# Patient Record
Sex: Male | Born: 1965 | Race: Black or African American | Hispanic: No | Marital: Married | Smoking: Never smoker
Health system: Southern US, Community
[De-identification: ages and names within clinical notes are randomized; demographics above are authoritative.]

## PROBLEM LIST (undated history)

## (undated) DIAGNOSIS — Z9889 Other specified postprocedural states: Secondary | ICD-10-CM

## (undated) DIAGNOSIS — I10 Essential (primary) hypertension: Secondary | ICD-10-CM

## (undated) DIAGNOSIS — I509 Heart failure, unspecified: Secondary | ICD-10-CM

---

## 2003-03-09 ENCOUNTER — Other Ambulatory Visit: Payer: Self-pay

## 2005-08-04 ENCOUNTER — Other Ambulatory Visit: Payer: Self-pay

## 2005-08-04 ENCOUNTER — Emergency Department: Payer: Self-pay | Admitting: Emergency Medicine

## 2005-08-08 ENCOUNTER — Inpatient Hospital Stay: Payer: Self-pay

## 2005-08-08 ENCOUNTER — Other Ambulatory Visit: Payer: Self-pay

## 2005-10-09 ENCOUNTER — Encounter: Admission: RE | Admit: 2005-10-09 | Discharge: 2005-10-09 | Payer: Self-pay | Admitting: Nephrology

## 2006-11-24 ENCOUNTER — Other Ambulatory Visit: Payer: Self-pay

## 2006-11-24 ENCOUNTER — Emergency Department: Payer: Self-pay | Admitting: Emergency Medicine

## 2007-03-02 ENCOUNTER — Other Ambulatory Visit: Payer: Self-pay

## 2007-03-02 ENCOUNTER — Inpatient Hospital Stay: Payer: Self-pay | Admitting: Internal Medicine

## 2008-05-21 ENCOUNTER — Inpatient Hospital Stay: Payer: Self-pay | Admitting: Internal Medicine

## 2008-07-28 ENCOUNTER — Emergency Department: Payer: Self-pay | Admitting: Emergency Medicine

## 2008-11-30 ENCOUNTER — Inpatient Hospital Stay: Payer: Self-pay | Admitting: Internal Medicine

## 2008-12-11 ENCOUNTER — Ambulatory Visit: Payer: Self-pay | Admitting: Cardiovascular Disease

## 2009-05-15 ENCOUNTER — Inpatient Hospital Stay: Payer: Self-pay | Admitting: Internal Medicine

## 2010-01-29 ENCOUNTER — Ambulatory Visit: Payer: Self-pay | Admitting: Cardiovascular Disease

## 2011-07-21 ENCOUNTER — Inpatient Hospital Stay: Payer: Self-pay | Admitting: Internal Medicine

## 2011-07-21 LAB — COMPREHENSIVE METABOLIC PANEL
Anion Gap: 8 (ref 7–16)
Bilirubin,Total: 0.8 mg/dL (ref 0.2–1.0)
Chloride: 104 mmol/L (ref 98–107)
Co2: 29 mmol/L (ref 21–32)
Creatinine: 1.42 mg/dL — ABNORMAL HIGH (ref 0.60–1.30)
EGFR (African American): >60
EGFR (Non-African Amer.): 58 — ABNORMAL LOW
Glucose: 141 mg/dL — ABNORMAL HIGH (ref 65–99)
Osmolality: 285 (ref 275–301)
Potassium: 3.8 mmol/L (ref 3.5–5.1)
SGPT (ALT): 21 U/L
Total Protein: 8.2 g/dL (ref 6.4–8.2)

## 2011-07-21 LAB — CBC
HCT: 47.4 % (ref 40.0–52.0)
MCH: 25.5 pg — ABNORMAL LOW (ref 26.0–34.0)
MCHC: 31.8 g/dL — ABNORMAL LOW (ref 32.0–36.0)
RDW: 15.9 % — ABNORMAL HIGH (ref 11.5–14.5)

## 2011-07-21 LAB — CK TOTAL AND CKMB (NOT AT ARMC)
CK, Total: 221 U/L (ref 35–232)
CK-MB: 2.4 ng/mL (ref 0.5–3.6)

## 2011-07-21 LAB — TROPONIN I
Troponin-I: 0.07 ng/mL — ABNORMAL HIGH
Troponin-I: 0.07 ng/mL — ABNORMAL HIGH

## 2011-07-22 LAB — BASIC METABOLIC PANEL
Anion Gap: 8 (ref 7–16)
Calcium, Total: 9.3 mg/dL (ref 8.5–10.1)
Co2: 28 mmol/L (ref 21–32)
Creatinine: 1.45 mg/dL — ABNORMAL HIGH (ref 0.60–1.30)
EGFR (African American): >60
EGFR (Non-African Amer.): 57 — ABNORMAL LOW

## 2011-07-22 LAB — CBC WITH DIFFERENTIAL/PLATELET
Basophil %: 1 %
Eosinophil %: 3.8 %
HCT: 46 % (ref 40.0–52.0)
HGB: 14.5 g/dL (ref 13.0–18.0)
Lymphocyte %: 43.5 %
MCV: 79 fL — ABNORMAL LOW (ref 80–100)
Monocyte %: 8.4 %
Neutrophil %: 43.3 %
WBC: 6.8 10*3/uL (ref 3.8–10.6)

## 2012-02-03 ENCOUNTER — Ambulatory Visit: Payer: Self-pay | Admitting: Hematology and Oncology

## 2012-02-03 LAB — BASIC METABOLIC PANEL
BUN: 24 mg/dL — ABNORMAL HIGH (ref 7–18)
Calcium, Total: 9 mg/dL (ref 8.5–10.1)
Creatinine: 1.74 mg/dL — ABNORMAL HIGH (ref 0.60–1.30)
EGFR (African American): 53 — ABNORMAL LOW
EGFR (Non-African Amer.): 45 — ABNORMAL LOW
Glucose: 122 mg/dL — ABNORMAL HIGH (ref 65–99)

## 2012-02-03 LAB — CBC CANCER CENTER
Basophil %: 0.3 %
Eosinophil #: 0.2 x10 3/mm (ref 0.0–0.7)
HGB: 14 g/dL (ref 13.0–18.0)
Monocyte #: 0.4 x10 3/mm (ref 0.2–1.0)
Monocyte %: 6.8 %
Neutrophil %: 46.5 %
RDW: 15.6 % — ABNORMAL HIGH (ref 11.5–14.5)

## 2012-02-13 ENCOUNTER — Ambulatory Visit: Payer: Self-pay | Admitting: Hematology and Oncology

## 2012-02-18 ENCOUNTER — Inpatient Hospital Stay: Payer: Self-pay | Admitting: Internal Medicine

## 2012-02-18 LAB — COMPREHENSIVE METABOLIC PANEL
Bilirubin,Total: 0.9 mg/dL (ref 0.2–1.0)
Co2: 22 mmol/L (ref 21–32)
Creatinine: 1.64 mg/dL — ABNORMAL HIGH (ref 0.60–1.30)
Potassium: 4.3 mmol/L (ref 3.5–5.1)
SGOT(AST): 21 U/L (ref 15–37)
SGPT (ALT): 23 U/L (ref 12–78)
Sodium: 136 mmol/L (ref 136–145)
Total Protein: 8 g/dL (ref 6.4–8.2)

## 2012-02-18 LAB — URINALYSIS, COMPLETE
Bacteria: NONE SEEN
Granular Cast: 1
Hyaline Cast: 11
Ketone: NEGATIVE
Nitrite: NEGATIVE
Specific Gravity: 1.018 (ref 1.003–1.030)
Squamous Epithelial: NONE SEEN
WBC UR: 1 /HPF (ref 0–5)

## 2012-02-18 LAB — DRUG SCREEN, URINE
Barbiturates, Ur Screen: NEGATIVE (ref ?–200)
Benzodiazepine, Ur Scrn: NEGATIVE (ref ?–200)
Cocaine Metabolite,Ur ~~LOC~~: NEGATIVE (ref ?–300)
MDMA (Ecstasy)Ur Screen: NEGATIVE (ref ?–500)
Methadone, Ur Screen: NEGATIVE (ref ?–300)
Tricyclic, Ur Screen: NEGATIVE (ref ?–1000)

## 2012-02-18 LAB — CBC WITH DIFFERENTIAL/PLATELET
Basophil %: 0.4 %
Eosinophil %: 1.2 %
HGB: 15.4 g/dL (ref 13.0–18.0)
Lymphocyte #: 1.8 10*3/uL (ref 1.0–3.6)
Lymphocyte %: 20.4 %
MCH: 25.7 pg — ABNORMAL LOW (ref 26.0–34.0)
Monocyte %: 4.3 %
Neutrophil #: 6.4 10*3/uL (ref 1.4–6.5)
Platelet: 158 10*3/uL (ref 150–440)
RBC: 5.97 10*6/uL — ABNORMAL HIGH (ref 4.40–5.90)
RDW: 15.8 % — ABNORMAL HIGH (ref 11.5–14.5)

## 2012-02-19 ENCOUNTER — Ambulatory Visit: Payer: Self-pay | Admitting: Neurology

## 2012-02-19 LAB — BASIC METABOLIC PANEL
Anion Gap: 8 (ref 7–16)
Chloride: 106 mmol/L (ref 98–107)
Creatinine: 1.35 mg/dL — ABNORMAL HIGH (ref 0.60–1.30)
EGFR (African American): >60
EGFR (Non-African Amer.): >60
Glucose: 109 mg/dL — ABNORMAL HIGH (ref 65–99)
Osmolality: 280 (ref 275–301)
Potassium: 3.7 mmol/L (ref 3.5–5.1)

## 2012-02-19 LAB — MAGNESIUM: Magnesium: 1.8 mg/dL

## 2012-02-19 LAB — CBC WITH DIFFERENTIAL/PLATELET
Eosinophil %: 1.6 %
HCT: 43.8 % (ref 40.0–52.0)
HGB: 14.2 g/dL (ref 13.0–18.0)
Lymphocyte %: 35.8 %
MCH: 25.8 pg — ABNORMAL LOW (ref 26.0–34.0)
MCHC: 32.4 g/dL (ref 32.0–36.0)
Monocyte #: 0.5 x10 3/mm (ref 0.2–1.0)
RBC: 5.49 10*6/uL (ref 4.40–5.90)

## 2012-02-19 LAB — LIPID PANEL: Triglycerides: 108 mg/dL (ref 0–200)

## 2012-02-19 LAB — HEMOGLOBIN A1C: Hemoglobin A1C: 6.5 % — ABNORMAL HIGH (ref 4.2–6.3)

## 2012-02-20 LAB — BASIC METABOLIC PANEL
BUN: 23 mg/dL — ABNORMAL HIGH (ref 7–18)
Calcium, Total: 9.5 mg/dL (ref 8.5–10.1)
Creatinine: 1.46 mg/dL — ABNORMAL HIGH (ref 0.60–1.30)
EGFR (Non-African Amer.): 56 — ABNORMAL LOW
Glucose: 105 mg/dL — ABNORMAL HIGH (ref 65–99)
Potassium: 4.1 mmol/L (ref 3.5–5.1)
Sodium: 139 mmol/L (ref 136–145)

## 2012-03-02 LAB — PROT IMMUNOELECTROPHORES(ARMC)

## 2012-03-02 LAB — KAPPA/LAMBDA FREE LIGHT CHAINS (ARMC)

## 2012-03-12 ENCOUNTER — Ambulatory Visit: Payer: Self-pay | Admitting: Hematology and Oncology

## 2012-09-30 ENCOUNTER — Inpatient Hospital Stay: Payer: Self-pay | Admitting: Internal Medicine

## 2012-09-30 LAB — BASIC METABOLIC PANEL
BUN: 18 mg/dL (ref 7–18)
Calcium, Total: 9.2 mg/dL (ref 8.5–10.1)
Co2: 27 mmol/L (ref 21–32)
Creatinine: 1.56 mg/dL — ABNORMAL HIGH (ref 0.60–1.30)
EGFR (Non-African Amer.): 51 — ABNORMAL LOW
Osmolality: 278 (ref 275–301)
Potassium: 3.7 mmol/L (ref 3.5–5.1)

## 2012-09-30 LAB — CBC
MCV: 80 fL (ref 80–100)
RBC: 6.27 10*6/uL — ABNORMAL HIGH (ref 4.40–5.90)
WBC: 9.3 10*3/uL (ref 3.8–10.6)

## 2012-09-30 LAB — PROTIME-INR
INR: 1
Prothrombin Time: 13.7 secs (ref 11.5–14.7)

## 2012-09-30 LAB — TROPONIN I
Troponin-I: 0.11 ng/mL — ABNORMAL HIGH
Troponin-I: 0.11 ng/mL — ABNORMAL HIGH
Troponin-I: 0.1 ng/mL — ABNORMAL HIGH

## 2012-09-30 LAB — CK TOTAL AND CKMB (NOT AT ARMC)
CK, Total: 282 U/L — ABNORMAL HIGH (ref 35–232)
CK, Total: 262 U/L — ABNORMAL HIGH (ref 35–232)
CK-MB: 5.1 ng/mL — ABNORMAL HIGH (ref 0.5–3.6)

## 2012-09-30 LAB — MAGNESIUM: Magnesium: 1.4 mg/dL — ABNORMAL LOW

## 2012-10-01 LAB — COMPREHENSIVE METABOLIC PANEL
Co2: 27 mmol/L (ref 21–32)
SGPT (ALT): 22 U/L (ref 12–78)
Sodium: 137 mmol/L (ref 136–145)
Total Protein: 6.5 g/dL (ref 6.4–8.2)

## 2012-10-02 LAB — BASIC METABOLIC PANEL
Anion Gap: 5 — ABNORMAL LOW (ref 7–16)
BUN: 24 mg/dL — ABNORMAL HIGH (ref 7–18)
Calcium, Total: 9.1 mg/dL (ref 8.5–10.1)
Chloride: 106 mmol/L (ref 98–107)
Co2: 27 mmol/L (ref 21–32)
Creatinine: 1.47 mg/dL — ABNORMAL HIGH (ref 0.60–1.30)
EGFR (African American): >60
EGFR (Non-African Amer.): 55 — ABNORMAL LOW
Glucose: 102 mg/dL — ABNORMAL HIGH (ref 65–99)
Osmolality: 280 (ref 275–301)
Potassium: 3.3 mmol/L — ABNORMAL LOW (ref 3.5–5.1)
Sodium: 138 mmol/L (ref 136–145)

## 2013-03-02 ENCOUNTER — Ambulatory Visit: Payer: Self-pay | Admitting: Internal Medicine

## 2013-03-16 ENCOUNTER — Inpatient Hospital Stay: Payer: Self-pay | Admitting: Internal Medicine

## 2013-03-16 LAB — CBC WITH DIFFERENTIAL/PLATELET
BASOS ABS: 0 10*3/uL (ref 0.0–0.1)
BASOS PCT: 0.7 %
EOS PCT: 1.5 %
Eosinophil #: 0.1 10*3/uL (ref 0.0–0.7)
HCT: 41.2 % (ref 40.0–52.0)
HGB: 13 g/dL (ref 13.0–18.0)
Lymphocyte #: 2.1 10*3/uL (ref 1.0–3.6)
Lymphocyte %: 50 %
MCH: 25.1 pg — AB (ref 26.0–34.0)
MCHC: 31.6 g/dL — AB (ref 32.0–36.0)
MCV: 79 fL — AB (ref 80–100)
MONO ABS: 0.5 x10 3/mm (ref 0.2–1.0)
Monocyte %: 11.1 %
NEUTROS ABS: 1.5 10*3/uL (ref 1.4–6.5)
Neutrophil %: 36.7 %
Platelet: 195 10*3/uL (ref 150–440)
RBC: 5.19 10*6/uL (ref 4.40–5.90)
RDW: 16.2 % — ABNORMAL HIGH (ref 11.5–14.5)
WBC: 4.2 10*3/uL (ref 3.8–10.6)

## 2013-03-16 LAB — COMPREHENSIVE METABOLIC PANEL
ALBUMIN: 3.2 g/dL — AB (ref 3.4–5.0)
ALK PHOS: 87 U/L
ALT: 43 U/L (ref 12–78)
ANION GAP: 4 — AB (ref 7–16)
AST: 47 U/L — AB (ref 15–37)
BUN: 19 mg/dL — ABNORMAL HIGH (ref 7–18)
Bilirubin,Total: 1.1 mg/dL — ABNORMAL HIGH (ref 0.2–1.0)
CHLORIDE: 105 mmol/L (ref 98–107)
CO2: 30 mmol/L (ref 21–32)
Calcium, Total: 8.4 mg/dL — ABNORMAL LOW (ref 8.5–10.1)
Creatinine: 1.53 mg/dL — ABNORMAL HIGH (ref 0.60–1.30)
GFR CALC NON AF AMER: 53 — AB
Glucose: 93 mg/dL (ref 65–99)
Osmolality: 279 (ref 275–301)
POTASSIUM: 3.7 mmol/L (ref 3.5–5.1)
SODIUM: 139 mmol/L (ref 136–145)
Total Protein: 6.6 g/dL (ref 6.4–8.2)

## 2013-03-16 LAB — PROTIME-INR
INR: 1
PROTHROMBIN TIME: 13.3 s (ref 11.5–14.7)

## 2013-03-17 LAB — BASIC METABOLIC PANEL
ANION GAP: 5 — AB (ref 7–16)
BUN: 16 mg/dL (ref 7–18)
CHLORIDE: 104 mmol/L (ref 98–107)
CREATININE: 1.37 mg/dL — AB (ref 0.60–1.30)
Calcium, Total: 8.5 mg/dL (ref 8.5–10.1)
Co2: 30 mmol/L (ref 21–32)
EGFR (Non-African Amer.): >60
Glucose: 81 mg/dL (ref 65–99)
Osmolality: 278 (ref 275–301)
Potassium: 3.4 mmol/L — ABNORMAL LOW (ref 3.5–5.1)
Sodium: 139 mmol/L (ref 136–145)

## 2013-04-01 ENCOUNTER — Ambulatory Visit: Payer: Self-pay | Admitting: Cardiovascular Disease

## 2014-01-12 DIAGNOSIS — Z9889 Other specified postprocedural states: Secondary | ICD-10-CM

## 2014-01-12 DIAGNOSIS — Z9289 Personal history of other medical treatment: Secondary | ICD-10-CM

## 2014-01-12 HISTORY — DX: Other specified postprocedural states: Z98.890

## 2014-01-12 HISTORY — DX: Personal history of other medical treatment: Z92.89

## 2014-05-04 NOTE — Discharge Summary (Signed)
PATIENT NAME:  Gregory Chase, Gregory Chase MR#:  [id number] DATE OF BIRTH:  1965-03-04  DATE OF ADMISSION:  02/18/2012 DATE OF DISCHARGE:  02/20/2012  DISCHARGE DIAGNOSES:  1. Left hemispheric parietal and frontal stroke.  2. Hypertension.  3. Chronic kidney disease.  4. Cardiomyopathy.  5. Obstructive sleep apnea.   PROCEDURES:  MRI of the brain, CT scan of the brain, carotid ultrasound, 2-D echocardiogram.   CONSULTATION: Neurology, Dr. 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 By the time of his arrival at the Emergency Room, his symptom had. He was evaluated by Dr. Manson Passey, who did call me expressing concern as to whether he should be admitted. I recommend that he be admitted for further evaluation. MRI of his brain subsequently showed left frontal and parietal infarcts consistent with a stroke. He was admitted to a monitored floor where he remained stable with permissive hypertension. He was started on Aggrenox as he has reported a history of exacerbation of gout with aspirin. First lipid profile showed an LDL below 100.  I did start him on a low dose of statin for secondary stroke prevention. His carotid ultrasound did not show obstructive carotid artery disease. His echocardiogram was negative for thrombus. He was evaluated by Neurology, who agreed with his work-up, and cleared him for discharge the following day. The patient'Jacobs Golab blood pressure improved after reintroduction of clonidine, and he was discharged home in satisfactory condition.   DIET: Low fat, low sodium.   ACTIVITY: The patient is restricted from duty until followup with me next week.    FOLLOWUP: Dr. Ellsworth Lennox in 1 week.   DISCHARGE PROCESS TIME SPENT: 32 minutes.    ____________________________ Silas Flood Ellsworth Lennox, MD sat:cb D: 02/21/2012 12:17:02 ET T: 02/21/2012 15:54:39 ET JOB#: [id number]  cc: Sheikh A. Ellsworth Lennox, MD, <Dictator> Charlesetta Garibaldi MD ELECTRONICALLY SIGNED 02/22/2012 12:47

## 2014-05-04 NOTE — Discharge Summary (Signed)
PATIENT NAME:  Gregory Chase, Gregory Chase MR#:  [id number] DATE OF BIRTH:  July 03, 1965  DATE OF ADMISSION:  09/30/2012 DATE OF DISCHARGE:  10/02/2012  ADMITTING PHYSICIAN: Susa Griffins, MD  DISCHARGING PHYSICIAN: Corky Downs, MD  DISCHARGE DIAGNOSES: 1.  Malignant hypertension.  2.  Flash pulmonary edema.  3.  Acute systolic congestive heart failure.   DISCHARGE MEDICATIONS: Please refer to discharge medication reconciliation.   HOSPITAL COURSE: This gentleman was admitted through the Emergency Room where he presented with uncontrolled hypertension and pulmonary edema with respiratory failure due to that. Please refer to the history and physical for details. The patient admitted to noncompliance with his dietary sodium restriction. Denied noncompliance with his medications. He was placed on intravenous Lasix. His antihypertensives were resumed with prompt symptomatic response, namely, after receiving IV labetalol and IV Lasix. His cardiac enzymes were elevated, however cardiology attributed this to demand ischemia.   The patient'[name] clinical status improved. He was weaned off oxygen. He was discharged home in a satisfactory condition.   CONSULTATIONS: Cardiology.   DISCHARGE INSTRUCTIONS:   DIET: Low-sodium.  ACTIVITY: As tolerated.  FOLLOWUP: With cardiology in 1 to 2 weeks and Dr. Ellsworth Lennox in 1 to 2 weeks.   ____________________________ Silas Flood Ellsworth Lennox, MD sat:jm D: 10/23/2012 12:19:15 ET T: 10/23/2012 14:19:37 ET JOB#: [id number]  cc: Marland Mcalpine A. Ellsworth Lennox, MD, <Dictator> Charlesetta Garibaldi MD ELECTRONICALLY SIGNED 11/02/2012 13:54

## 2014-05-04 NOTE — Consult Note (Signed)
PATIENT NAME:  Gregory Chase, NUON MR#:  [id number] DATE OF BIRTH:  August 20, 1965  DATE OF CONSULTATION:    REFERRING PHYSICIAN:   CONSULTING PHYSICIAN:  Pauletta Browns, MD  REASON FOR CONSULTATION:  New stroke.   HISTORY OF PRESENT ILLNESS:  This is a pleasant 49 year old male with past medical history of nonischemic cardiomyopathy, sleep apnea, known last EF in the past year from 30% to 40%, CKD, chronic hypertension who presented with transient episode of confusion and slurred speech.  Information obtained from patient and the patient's family who are at bedside.  The patient states symptoms started around 10:50 a.m. yesterday.  He works as a Personnel officer.  He was walking and attempted to open a door and had difficulty finding a button to open that door.  His coworker saw that, opened the door and brought him to sit down in a room.  When the co-worker was trying to speak with the patient, the patient says that it was difficult for him to get his words out.  These symptoms lasted for about 10 minutes and upon admission to Emergency Department his symptoms have resolved.  Currently he is completely back to his baseline.  Evaluation of his CAT scan showed no acute intracranial pathology.  Ultrasound carotids evaluated.  They did not show any hemodynamic stenosis.  I personally reviewed MRI of the brain that showed a significant left parietal infarct and very, very minimal tiny left frontal infarct.  The left parietal infarct is most over the watershed distribution, kind of the left MCA/PCA territory.    PAST MEDICAL HISTORY:  Significant for nonischemic cardiomyopathy as described above with ejection fraction of 30% to 40%, history of hypertension, gout, CKD, sleep apnea currently not on CPAP and obesity.   FAMILY HISTORY:  Mother died at age of 25 of coronary artery disease.  Dad with significant history of diabetes and as per patient died from a diabetic coma.   SOCIAL HISTORY:  Does not  smoke.  No alcohol.  No drug use and he lives with his wife.  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   ALLERGIES:  INCLUDE ACE INHIBITORS.  OUTPATIENT HOME MEDICATIONS:  Were reviewed and included Benicar, Coreg, clonidine, digoxin, hydralazine.   REVIEW OF SYSTEMS:  The patient currently has no complaints.   CONSTITUTIONAL:  There is no fever, fatigue, weakness, weight change.   HEENT:  No blurry vision.  No tinnitus.   RESPIRATORY:  No cough.   CARDIOVASCULAR:  Denies any chest pain.   GASTROINTESTINAL:  No nausea.  No vomiting.  No dysuria.  No hematuria.   MUSCULOSKELETAL:  Denies any joint pain.  NEUROLOGICAL:  As above.  Denies any anxiety or depression.   PHYSICAL EXAMINATION: VITAL SIGNS:  Pulse is 79, blood pressure 189/119, pulse ox is 98 and he is 98% on room air in terms of O2 saturation.  NEUROLOGICAL:  He is alert, awake, oriented to time, place, location and date.  He is able to tell me the President of the [place].   CRANIAL NERVES:  Extraocular movements are intact.  Pupils 4 mm to 3 mm, equal in midline.  Visual fields appear to be intact bilaterally.  Facial sensation, facial motor intact.  Tongue is midline.  Uvula elevates symmetrically.  Palate is symmetrical.  Shoulder shrug is equal bilaterally.  MOTOR:  Tone is within normal limits.  EXTREMITIES:  Upper extremities are V/V and lower extremities are V/V bilaterally as well.  Reflexes diminished to 1+ throughout.  Sensation intact to light touch and pinprick.   COORDINATION:  Finger-to-nose intact.  Gait, no signs of ataxia.   IMPRESSION:  A 50 year old male with significant medical comorbidities as above presenting with 10 to 11 minute of transient confusion as well as what looks like possible aphasia versus dysarthria.  Imaging as reviewed above.  At this point would finish up stroke work-up.  The patient is status post echocardiogram.  Let us make sure we get that back  before sending him out and making sure there are no signs of a thrombus.  The patient is on tele and as of right now there is no signs of any type of arrhythmia.  Prior to discharge would like to obtain hypercoagulable profile as the patient is 49 years old, should not have a stroke.  The patient does not have to be in the hospital until hypercoagulable profile comes back as this can be followed up as an outpatient and patient was told to obtain a neurologist to follow up with.  He states he has difficulty taking aspirin because he has gout and that flares up his gout.  Agree with continued Aggrenox twice a day and he was told that if he starts having headaches, which is a common side effect for Aggrenox, he will be switched to Plavix 75 mg.  He is also to follow up with pulmonology because he has a long-term history of sleep apnea.  It was discussed with his family regarding patient's weight loss and exercise program as well as diet.  It was also told to patient importance of blood pressure control and it was described to him, it is probably difficult for him to obtain that systolic blood pressure of 120 as he is chronically hypertensive and his autoregulation curve is shifted to the right causing him difficulty obtaining that systolic blood pressure less than 120, so at this point would be okay if his home blood pressure would be running close to 140 to 160, which will improve with weight loss.   It was a pleasure seeing this patient.   TIME SPENT:  Total of 1 hour spent on obtaining documentation, speaking to the family, physical examination, as well as reviewing the imaging and the history.    Thank you, it was a pleasure seeing this patient.      ____________________________ Pauletta Browns, MD yz:ea D: 02/19/2012 18:03:10 ET T: 02/19/2012 23:23:49 ET JOB#: [id number]  cc: Pauletta Browns, MD, <Dictator> Pauletta Browns MD ELECTRONICALLY SIGNED 02/21/2012 18:14

## 2014-05-04 NOTE — H&P (Signed)
PATIENT NAME:  ELLEN, REASONER MR#:  [id number] DATE OF BIRTH:  [birth year]  DATE OF ADMISSION:  09/30/2012  PRIMARY CARE PHYSICIAN: Dr. Ellsworth Lennox.   REFERRING PHYSICIAN: Dr. Bayard Males.   CHIEF COMPLAINT: Shortness of breath.   HISTORY OF PRESENT ILLNESS: Mr. Lambrecht is a 49 year old African American male with a past medical history of nonischemic cardiomyopathy with EF 30% to 40%, chronic renal insufficiency, congestive heart failure, obstructive sleep apnea not on CPAP. Presented to the Emergency Department with sudden onset of shortness of breath. The patient is on multiple blood pressure medications. Usually takes one at night but the other clonidine takes if he remembers. Tonight when he was taking the clonidine, he felt somewhat nauseated followed by severe respiratory distress. Concerning this, EMS was called. When EMS arrived, the patient was in severe respiratory distress. The patient was initially placed on BiPAP. Initial blood pressure was 230/130. The patient received labetalol and Lasix with improvement of the blood pressure to 139/99. The patient usually does not check his blood pressure. The patient states drinks a significant amount of water during the daytime. Even though the patient states has been compliant with fluid restriction, wife who was sitting next to him states noncompliant with salt and fluid intake. In regards to sleep apnea, the patient states has lost followup. Frequently falls asleep. Feels fatigued during the daytime.   PAST MEDICAL HISTORY:  1. Hypertension.  2. Hyperlipidemia.  3. Obesity.  4. Nonischemic cardiomyopathy with EF of 30% to 40%.  5. Chronic renal insufficiency.  6. Congestive heart failure, systolic.  7. Obstructive sleep apnea.  8. Obesity.   PAST SURGICAL HISTORY: None.   ALLERGIES:  1. ACE INHIBITORS.  2. ASPIRIN.  3. BEE STINGS.   HOME MEDICATIONS:  1. Spironolactone 25 mg 2 times a day.  2. Hydralazine 25 mg 2 times a  day.  3. Lasix 40 mg once a day.  4. Digoxin 125 mcg every other day.  5. Zestril 5 mg once a day.  6. Clonidine 0.3 mg 2 times a day.  7. Carvedilol 25 mg 2 times a day.  8. Benicar 40 mg once a day.  9. Aspirin/dipyridamole 25/200 mg 2 times a day.  10. Allopurinol 100 mg once a day.   SOCIAL HISTORY: No history of smoking, drinking alcohol or using illicit drugs. Married, lives with his wife.   FAMILY HISTORY: Mother died at the age of 15 with coronary artery disease. Father with diabetes mellitus. Sister with high blood pressure.   REVIEW OF SYSTEMS:  CONSTITUTIONAL: Denies any weakness, fatigue.  EYES: No change in vision.  ENT: No change in hearing. No sore throat.  RESPIRATORY: Has shortness of breath. Denies having any cough.  CARDIOVASCULAR: Denies any chest pain, lower extremity swelling.  GASTROINTESTINAL: Denies any nausea, vomiting, abdominal pain.  GENITOURINARY: No dysuria or hematuria.  HEMATOLOGIC: No easy bruising or bleeding.  ENDOCRINE: No polyuria or polydipsia.  SKIN: No rash or lesions.  MUSCULOSKELETAL: No joint pains and aches.  NEUROLOGIC: No weakness or numbness in any part of the body.   PHYSICAL EXAMINATION:  GENERAL: This is a well-built, well-nourished, age-appropriate male lying down in the bed, not in distress.  VITAL SIGNS: Temperature 37.7, pulse 68, blood pressure 139/83, respiratory rate of 16, oxygen saturation is 99% on 2 liters of oxygen.  HEENT: Head normocephalic, atraumatic. There is no scleral icterus. Conjunctivae normal. Pupils equal and react to light. Extraocular movements are intact. Mucous membranes moist. No pharyngeal erythema.  NECK: Supple. No lymphadenopathy. No JVD. No carotid bruit. No thyromegaly.  CHEST: No focal tenderness.  LUNGS: Bilaterally clear to auscultation.  HEART: S1 and S2 regular. No murmurs are heard.  ABDOMEN: Obese. Bowel sounds present. Soft, nontender, nondistended. Could not appreciate any  hepatosplenomegaly.  SKIN: No rash or lesions.  MUSCULOSKELETAL: Good range of motion in all of the extremities.  NEUROLOGIC: The patient is alert, oriented to place, person and time. Cranial nerves II through XII intact. Motor 5/5 in upper and lower extremities.   LABORATORIES: CBC is completely within normal limits. CMP: Serum glucose 209, BUN 18, creatinine of 1.56. The rest of all of the values are within normal limits.   CK 359, CK-MB of 4.9, troponin of 0.11. BNP 1500.   EKG, 12 lead, shows sinus rhythm with T wave inversions in the inferolateral leads. Poor R wave progression in the inferior leads.   ASSESSMENT AND PLAN: Mr. Tarnow is a 49 year old with uncontrolled hypertension who comes to the Emergency Department with severe respiratory distress.  1. Congestive heart failure, systolic acute on chronic: The patient initially required BiPAP. Currently off of BiPAP after a good diuresis and blood pressure control. Will continue with Lasix b.i.d. for the next 2 doses. After that, closely monitor the kidney function.  2. Accelerated hypertension: Very highly concerning about noncompliance with the medication intake, fluid and salt intake, as well as the patient having underlying sleep apnea and being noncompliant. Discussed with the patient that the patient may benefit being on clonidine patch or wean him off of the clonidine and start him on other antihypertensive medications like labetalol or amlodipine. Also emphasized the importance of fluid and salt intake as well as the importance of being on CPAP. The patient expressed understanding.  3. Obstructive sleep apnea: The patient will benefit from getting a repeat sleep study done and establish with a CPAP machine.  4. Hyperglycemia: Will obtain hemoglobin A1c. The patient does not carry a diagnosis of diabetes mellitus.  5. Elevated cardiac enzymes: Most likely, this is demand ischemia; however, will continue to monitor cardiac enzymes x3.   6. Keep the patient on deep vein thrombosis prophylaxis with Lovenox.  TIME SPENT: 45 minutes.     ____________________________ Susa Griffins, MD pv:gb D: 09/30/2012 02:49:31 ET T: 09/30/2012 03:10:40 ET JOB#: [id number]  cc: Susa Griffins, MD, <Dictator> Susa Griffins MD ELECTRONICALLY SIGNED 10/04/2012 21:05

## 2014-05-04 NOTE — Consult Note (Signed)
Brief Consult Note: Diagnosis: CHF, acclerated hypertension.   Comments: Patient has h/o CHF. cardomyopathy, HTN, HLD, CRI, sleep apnea who presented with acute SOB, BP 230/130 last evening and fluid overload could be from flash pulmonary edema/ fluid overload or dietary non-complaince and excessive Na+ intake .States has ben compliant with meds and denies CP, chest pressure,  BP better controlled today and pt denies SOB after IV Lasix. Elevations in CE likely from demand ischemia from respiratory distress, cycle CEs, will review echo to check LVEF, look for wall motion abnormalities. Agree with BB, ARB, dig, diuretics, agressive BP management with Clonidine and hydralazine. Patient needs repeat sleep study as has ectopy on tele with short 4 beat run of SVT, sinus arrhythmia, PVCs and may have R sided heart failure conributing to this exacerbation. Will follow with you.  Electronic Signatures: Radene Knee (MD)   (Signed 19-Sep-14 08:56)  Co-Signer: Brief Consult Note Donte Kary A (PA-C)   (Signed 19-Sep-14 08:39)  Authored: Brief Consult Note  Last Updated: 19-Sep-14 08:56 by Radene Knee (MD)

## 2014-05-04 NOTE — Consult Note (Signed)
PATIENT NAME:  Gregory Chase, Gregory Chase MR#:  [id number] DATE OF BIRTH:  May 31, 1965  DATE OF CONSULTATION:  09/30/2012  REFERRING PHYSICIAN:  Susa Griffins, MD CONSULTING PHYSICIAN:  Verta Ellen, PA-C  DICTATING FOR: Laurier Nancy, MD  PRIMARY CARE PHYSICIAN: Silas Flood. Tejan-Sie, MD  REASON FOR CONSULTATION: Congestive heart failure, accelerated hypertension.   HISTORY OF PRESENT ILLNESS: Gregory Chase is a 49 year old African-American male who is well known to our practice. The patient notes that yesterday evening after eating dinner, he was nauseated, which sometimes happens after he takes his medications. He notes that shortly after 10:00 p.m., he had acute shortness of breath, having to gasp for air and found it very difficult to breathe, and EMS was summoned. The patient's blood pressure was found to be 230/130, and he received labetalol and Lasix, which improved his blood pressure and breathing. The patient has been taking his blood pressure medication; however, he has not been compliant with fluid restriction and sodium restriction. He has not had any orthopnea, increased pedal edema, but did have some wheezing in the evenings for the last several days. He denies any chest pain, chest pressure, dizziness, lightheadedness and syncope. He was scheduled for a sleep study; however, the patient never had the procedure completed.   PAST MEDICAL HISTORY:  1. Hypertension.  2. Hyperlipidemia.  3. Obesity.  4. Nonischemic cardiomyopathy with last ejection fraction about 30% to 40%.  5. Chronic renal insufficiency.  6. Systolic congestive heart failure.  7. Obstructive sleep apnea.  8. Gout.   PAST SURGICAL HISTORY: None.   ALLERGIES: ACE INHIBITORS, ASPIRIN AND BEE STINGS.   HOME MEDICATIONS:  1. Spironolactone 25 mg 1 tablet p.o. twice daily.  2. Hydralazine 25 mg p.o. twice daily.  3. Lasix 40 mg twice daily.  4. Digoxin 125 mcg every other day.  5. Clonidine 0.3  mg twice daily.  6. Carvedilol 25 mg twice daily.  7. Benicar 40 mg once daily.  8. Aggrenox twice daily.  9. Allopurinol 100 mg daily.   SOCIAL HISTORY: The patient does not smoke, drink alcohol or use illicit drugs. He is married and lives with his wife. He is employed full time.   FAMILY HISTORY: Coronary artery disease in his mother. Father with diabetes mellitus. Sister with hypertension.   REVIEW OF SYSTEMS:  CONSTITUTIONAL: The patient denies weakness, fatigue, fevers.  EYES: The patient denies any blurry vision.  ENT: The patient denies any tinnitus, hearing deficits.  RESPIRATORY: The patient has had acute shortness of breath, wheezing. Denies cough.  CARDIOVASCULAR: The patient denies any chest pain, chest pressure or increased pedal edema.  MUSCULOSKELETAL: The patient denies any joint aches or pains.   PHYSICAL EXAMINATION:  GENERAL: This is an obese African-American male who is not in any acute distress. He is alert and oriented x3.  VITAL SIGNS: Temperature of 99.1 degrees Fahrenheit, heart rate is 79, respiratory rate is 17, blood pressure 132/86, O2 saturation is 97% on room air.  HEENT: Head atraumatic, normocephalic. Eyes: Pupils are round and equal. Conjunctivae are pink. Ears and nose are normal to external inspection. Mouth: Moist mucous membranes.  NECK: Supple. Trachea is midline. No carotid bruits appreciated.  LUNGS: Clear to auscultation bilaterally with no accessory muscle use.  CARDIOVASCULAR: Regular rate with some ectopy noted. No murmurs appreciated.  ABDOMEN: Obese. Bowel sounds present in all 4 quadrants. It is soft and nontender to palpation.  EXTREMITIES: No cyanosis, clubbing or edema.   ANCILLARY DATA:  EKG  with normal sinus rhythm with T wave inversions in the inferolateral leads.  Chest x-ray: Cardiomegaly with pulmonary edema, suspicious for congestive heart failure.   LABORATORY DATA: Sodium 135, potassium 3.7, chloride 101, estimated GFR is  60. CK total is 359, CK-MB is 4.9, troponin I 0.11. White blood cell count is 9.3, hemoglobin 16.1, hematocrit 50.3, platelet count 272,000. PT 13.7. INR is 1.0.   ASSESSMENT AND PLAN:  1. Acute shortness of breath in a patient with known systolic congestive heart failure, cardiomyopathy and accelerated hypertension. The patient's blood pressure was extremely elevated. He may have had some flash pulmonary edema that has contributed to his breathing difficulties. The patient's blood pressure improved drastically with IV labetalol and IV Lasix. He denies any chest pain or chest pressure at this time. His breathing has improved. Echocardiogram has been ordered in order to assess his ejection fraction and diastolic function and rule out any wall motion abnormalities. Agree with the current management of beta blockers, angiotensin receptor blockers, diuretics, aggressive blood pressure management. He has sleep apnea that has been untreated, and he may have right-sided heart failure that is contributing to this exacerbation. He did have a short 4-beat run of supraventricular tachycardia and has had some ectopy, which could be related to electrolyte disturbances or untreated sleep apnea. We will add a magnesium to his current labs. Continue to monitor I's and O's.  2. Elevated cardiac enzymes. Elevations in patient's initial cardiac enzymes likely from acute respiratory distress, demand ischemia. Will continue to cycle his cardiac enzymes and again checked wall motion abnormality on echo. Denies any chest pain or chest pressure at this time. 3. Hypertension. The patient notes that he has been compliant with his medications, and blood pressure is well controlled at this point. If blood pressure becomes elevated, we can increase his hydralazine dose.  4. Chronic renal insufficiency. The patient has known renal insufficiency and will continue to monitor, especially with the addition of diuretic therapy.   Thank you  very much for this consultation and allowing [place] to participate in this patient's care. We will continue to follow the patient with you.   ____________________________ Verta Ellen, PA-C mam:OSi D: 09/30/2012 08:51:26 ET T: 09/30/2012 09:17:26 ET JOB#: [id number]  cc: Verta Ellen, PA-C, <Dictator> Ying Blankenhorn A Aedyn Kempfer PA ELECTRONICALLY SIGNED 10/03/2012 9:26

## 2014-05-04 NOTE — H&P (Signed)
PATIENT NAME:  Gregory Chase, Gregory Chase MR#:  [id number] DATE OF BIRTH:  1965-07-25  DATE OF ADMISSION:  02/18/2012  REFERRING PHYSICIAN: Caleen Jobs. Brien Mates, MD  PRIMARY CARE PHYSICIAN: Sheikh A. Ellsworth Lennox, MD  CARDIOLOGIST: Laurier Nancy, MD  CHIEF COMPLAINT: Transient confusion and slurred speech.    HISTORY OF PRESENT ILLNESS: The patient pleasant is a pleasant 49 year old African American male with history of nonischemic cardiomyopathy, last EF of about 30% to 40% per him during an echo about 7 to 8 months ago with Dr. Welton Flakes, with CKD and hypertension, who was at work this morning and a coworker noticed the patient to be somewhat confused and had slurred speech. Symptoms started about 10:50 to 11 and lasted several minutes only. The patient went back to his normal self. He did not have any other symptoms at that time, but he stated that while they took his blood pressure, it was significantly elevated but he does not know what number it was. Currently he is back to normal and slurred speech is gone. He answers appropriately and CAT scan of the head does not show any acute stroke. Hospitalist service was contacted for further evaluation and management.   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   FAMILY HISTORY: Mom died at age 40 with history of CAD. Dad with diabetes. Sister with high blood pressure.   SOCIAL HISTORY: No tobacco, alcohol or drug use. Married. Lives with his wife. 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   ALLERGIES: ACE INHIBITORS CAUSING COUGH, ASPIRIN FULL-DOSE HE BELIEVES CAUSED GOUT FLARE, AND HISTORY OF BEE STINGS.   OUTPATIENT MEDICATIONS: Benicar 40 mg daily, Coreg 25 mg 2 times a day, clonidine 0.3 mg 2 times a day, digoxin 125 mcg daily, hydralazine 25 mg 2 times a day, Lasix 40 mg 2 times a day, spironolactone 25 mg 2 times a  day.   REVIEW OF SYSTEMS:  CONSTITUTIONAL: No fever, fatigue, weakness or weight changes.  EYES: No blurry vision, double vision or redness.  ENT: No tinnitus, hearing loss or snoring.  RESPIRATORY: No cough, wheezing, shortness of breath or COPD.  CARDIOVASCULAR: Denies chest pain. Has systolic CHF, nonischemic cardiomyopathy and high blood pressure. No history of syncope.  GASTROINTESTINAL: No nausea, vomiting, diarrhea, abdominal pain or melena.  GENITOURINARY: Denies dysuria or hematuria. HEMATOLOGIC AND LYMPHATIC: Denies anemia or easy bruising.  SKIN: Denies any new rashes.  MUSCULOSKELETAL: Denies arthritis or joint pains. Has history of gout.  NEUROLOGIC: Denies weakness, numbness. No history of stroke.  PSYCHIATRIC: Denies anxiety or insomnia.   PHYSICAL EXAMINATION:  VITAL SIGNS: Temperature on arrival 98.2, pulse rate 69, respiratory rate 20, blood pressure 184/106, last blood pressure 145/70, O2 sat 98% on room air.  GENERAL: The patient is a morbidly obese African American male sitting in bed in no obvious distress, talking in full sentences.  HEENT: Normocephalic, atraumatic. Pupils are equal and reactive. Anicteric sclerae. Extraocular muscles intact. Moist mucous membranes.  NECK: Supple. No thyroid tenderness or cervical lymphadenopathy. CARDIOVASCULAR: S1, S2, regular rate and rhythm. No significant murmurs or rubs appreciated.  LUNGS: Clear to auscultation. No wheezing or rhonchi. Good air entry.  ABDOMEN: Soft, nontender, nondistended. No organomegaly appreciated. Positive bowel sounds in all quadrants.  EXTREMITIES: No significant lower extremity edema. NEUROLOGIC: Cranial nerves II through XII grossly intact. Strength is 5/5 in all extremities. Sensation is  intact to light touch.  PSYCHIATRIC: Pleasant, cooperative, awake, alert, oriented x 3.   LABORATORY, RADIOLOGICAL AND DIAGNOSTIC DATA: Glucose 106, BUN 20, creatinine 1.64 (it was 1.74 on January 22), GFR placing  him in stage III CKD. LFTs within normal limits. U-tox negative, ethanol not detectable. WBC 8.7, hemoglobin 15.4, platelets 158. Urinalysis not suggestive of infection. CT scan: Normal noncontrast CT scan of the brain for age, no evidence of evolving ischemic infarct. EKG: Normal sinus rhythm, left axis deviation, rate 64, no acute ST elevations or depressions. There are some T wave inversions in I and aVL.   ASSESSMENT AND PLAN: We have a pleasant 49 year old male with history of hypertension, nonischemic cardiomyopathy, chronic kidney disease stage II to III, who presents with brief episode of confusion and slurred speech noticed by coworker, lasted several minutes and all resolved, and patient with negative CAT scan and neurologically intact. He was noted to have elevated blood pressure at that time and did have elevated blood pressure on arrival here. At this point, would admit the patient for observation and evaluate for a transient ischemic attack. The symptoms suggested possible transient ischemic attack. Would try to control the blood pressure and order MRI of the brain to look for evidence for a stroke and obtain echocardiogram, as there is no recent echo here on file. Would also check ultrasound of carotids to evaluate for any significant carotid stenosis. He has a negative urine toxicology and negative for alcohol here. Would check frequent neuro checks and a lipid panel. Would place him on telemetry off unit to evaluate for any significant arrhythmias. Would start him on aspirin at a lower dose as he states that the higher dose triggered gout, and see if he can tolerate the low-dose aspirin, and start him on simvastatin. He is not on a cholesterol medicine at this point. Would check lipid profile too. His chronic kidney disease appears to be stable, as well as his chronic systolic congestive heart failure. Would see what his echocardiogram shows in terms of his ejection fraction and also look for  significant valvular abnormalities. Would continue his outpatient medications, but I discussed the possibility of getting him off of the clonidine as an outpatient, as it could make blood pressure labile, as we have other alternatives such as going up on the hydralazine to compensate for the clonidine. Would continue allopurinol, as he states he is on it, to make sure that a gout flare does not occur. Will start him on heparin for deep vein thrombosis prophylaxis.   CODE STATUS: The patient is full code.  TOTAL TIME SPENT: 50 minutes.   ____________________________ Krystal Eaton, MD sa:jm D: 02/18/2012 14:02:17 ET T: 02/18/2012 15:16:05 ET JOB#: [id number]  cc: Krystal Eaton, MD, <Dictator> Laurier Nancy, MD Silas Flood. Ellsworth Lennox, MD Krystal Eaton MD ELECTRONICALLY SIGNED 02/29/2012 15:46

## 2014-05-05 NOTE — Consult Note (Signed)
PATIENT NAME:  Gregory Chase, Gregory Chase MR#:  [id number] DATE OF BIRTH:  04-05-1965  DATE OF CONSULTATION:  03/17/2013  REFERRING PHYSICIAN:   CONSULTING PHYSICIAN:  Laurier Nancy, MD  HISTORY OF PRESENT ILLNESS: This is a 49 year old African American male with a past medical history of CHF with ejection fraction 25% to 30% who came into the hospital because after cardiac catheterization he needed IV fluid and has decompensated CHF.  The patient has been having shortness of breath, PND, orthopnea, and leg swelling for the past 1 week. He also was diagnosed with atrial fibrillation and was started on p.o. amiodarone, but did not convert to sinus rhythm. His echocardiogram showed severe LV dysfunction, left ventricular ejection fraction 25% to 30% with moderate to severe mitral regurgitation. He was also diagnosed with sleep apnea. He is supposed to get CPAP titration at the hospital because he has abnormal sleep study and it has not been done yet.   PAST MEDICAL HISTORY: Hypertension, hyperlipidemia, morbid obesity, nonischemic cardiomyopathy, stage III chronic kidney disease.   MEDICATIONS: Aldactone 25 mg, hydralazine 25 b.i.d., Lasix 40 mg once a day, digoxin, Benicar, clonidine, Coreg 25 b.i.d., aspirin, and he was started on Eliquis 5 mg b.i.d. and allopurinol. He was on Xarelto and he was switched to Eliquis for the renal insufficiency.   SOCIAL HISTORY: Denies EtOH abuse or smoking.   FAMILY HISTORY: Positive for coronary artery disease.   PHYSICAL EXAMINATION: GENERAL: He is alert and oriented x3, in no acute distress.  VITAL SIGNS: Stable.  NECK: Positive JVD.  LUNGS: Crepitation at the bases. HEART: Irregularly irregular. Normal S1, S2. No audible murmur.  ABDOMEN: Soft, nontender. Positive bowel sounds.  EXTREMITIES: 1+ pedal edema.   DIAGNOSTIC DATA: EKG shows A-fib, 77 beats per minute with occasional PVCs.   Labs: Creatinine is 1.53.   ASSESSMENT AND PLAN: The patient has  new onset atrial fibrillation. He initially was on Xarelto and was switched to Eliquis for the renal insufficiency. He underwent cardiac catheterization yesterday which showed normal coronaries. Ejection fraction was not evaluated because his ejection fraction was 25% on echocardiogram. He underwent cardioversion today and successfully converted with 200 joules to sinus rhythm. I advised continuation of amiodarone, advised continuation of the rest of the medications, and advised right away set up CPAP titration because he will go back into atrial fibrillation if he does not get treatment with CPAP.   Thank you very much for the referral.  ____________________________ Laurier Nancy, MD sak:sb D: 03/17/2013 07:56:43 ET T: 03/17/2013 08:10:56 ET JOB#: [id number]  cc: Laurier Nancy, MD, <Dictator> Laurier Nancy MD ELECTRONICALLY SIGNED 03/27/2013 12:13

## 2014-05-05 NOTE — H&P (Signed)
PATIENT NAME:  Gregory Chase, Gregory Chase MR#:  [id number] DATE OF BIRTH:  30-Sep-1965  DATE OF ADMISSION:  03/16/2013  PRIMARY CARE PHYSICIAN: Gillermo Murdoch, MD   PRIMARY CARDIOLOGIST: Dr. Welton Flakes.   CHIEF COMPLAINT: Shortness of breath and atrial fibrillation.   HISTORY OF PRESENT ILLNESS: The patient is a 49 year old morbidly obese African American male patient with history of chronic congestive heart failure with EF of 25% to 30%,  chronic atrial fibrillation, hypertension, hyperlipidemia, who presents to outpatient procedures area for a cardiac catheterization. The cardiac catheter showed no significant coronary artery disease. It did show pulmonary hypertension and mitral regurgitation. The patient does have CKD, stage III, with a creatinine of 1.53 and is being admitted for IV hydration and also cardioversion for his atrial fibrillation.   Initially, his AFib and congestive heart failure were thought to be secondary to ischemic heart disease, but presently with normal coronaries, the patient likely has rate-related congestive heart failure and needs cardioversion. The patient presently is on a bicarb drip. He feels well. No shortness of breath. He recently got discharged from [facility] 2 days prior after being treated for acute-on-chronic systolic CHF with IV Lasix. He mentioned that he had significant lower extremity edema which has resolved and was unable to lie flat but presently is lying flat after his cath. He does have rate-controlled AFib in the range of 70s on the tele monitor.   PAST MEDICAL HISTORY:  1.  Hypertension.  2.  Hyperlipidemia.  3.  Morbid obesity.  4.  Obstructive sleep apnea.  5.  Nonischemic cardiomyopathy.  6.  CKD, stage III.  7.  Systolic congestive heart failure with EF of 25% to 30%.   PAST SURGICAL HISTORY: None.   ALLERGIES: ACE INHIBITORS, ASPIRIN, BEE STINGS, ALTHOUGH THE PATIENT IS ON ASPIRIN-DIPYRIDAMOLE COMBINATION AT THIS TIME.   HOME MEDICATIONS:   1.  Aldactone 25 mg two times a day.  2.  Hydralazine 25 mg two times a day.  3.  Lasix 40 mg two times a day.  4.  Digoxin 125 mcg daily .  5.  Benicar 40 mg daily.  6.  Clonidine 0.3 mg two times a day.  7.  Coreg 25 mg two times a day.  8.  Aspirin dipyridamole 25/200 mg two times a day.  9.  Allopurinol 100 mg daily.   SOCIAL HISTORY: The patient does not smoke. No alcohol. No illicit drugs. Married and lives with his wife. Works as a guard in a prison.   FAMILY HISTORY: Mother died at age of 16 with CAD, father with diabetes mellitus. Sister has high blood pressure.   REVIEW OF SYSTEMS:  CONSTITUTIONAL: No fever, fatigue, weakness, weight loss, weight gain.  EYES: No blurred vision, pain or redness. ENT: No tinnitus, ear pain, hearing loss.  RESPIRATORY: No cough, wheezing, or hemoptysis.  CARDIOVASCULAR: No chest pain, orthopnea, or edema.  GASTROINTESTINAL: No nausea, vomiting, diarrhea, abdominal pain.  GENITOURINARY: No dysuria, hematuria, or frequency.  ENDOCRINE: No polyuria, nocturia, thyroid problems.  HEMATOLOGIC AND LYMPHATIC: No anemia, easy bruising, or bleeding.  INTEGUMENTARY: No acne, rash, lesion.  MUSCULOSKELETAL: No back pain, arthritis.  NEUROLOGIC: No focal numbness, weakness, seizures.  PSYCHIATRIC: Denies any depression.   PHYSICAL EXAMINATION:  VITAL SIGNS: Temperature 98.1; pulse of 72, irregular; respirations 16; blood pressure 133/90; saturating 98% on 2 liters oxygen.  GENERAL: Morbidly obese African American male patient lying in bed, seems comfortable, conversational, cooperative with exam.  PSYCHIATRIC: Alert and oriented x3. Mood and  affect appropriate. Judgment intact.  HEENT: Atraumatic, moist and pink. External ears and nose normal. No pallor or icterus. At the midline. No carotid bruit, JVD.  CARDIOVASCULAR: S1, S2, without any murmurs. Peripheral pulses 2+. No edema.  RESPIRATORY: Normal work of breathing. Clear to auscultation on both  sides.  GASTROINTESTINAL: Soft abdomen, nontender. Bowel sounds present. No hepatosplenomegaly palpable.  SKIN: Warm and dry. No petechia, rash, or ulcers.  MUSCULOSKELETAL: No joint swelling, redness, effusion of the large joints. Normal muscle tone.  NEUROLOGICAL: Motor strength 5/5 in upper and lower extremities. Sensation  intact all over.  EXTREMITIES: Pulses 2+ in upper and lower extremities. Right femoral cath site shows no hematoma or bleeding.   LABORATORY, DIAGNOSTIC AND RADIOLOGICAL: Glucose 93, BUN 19, creatinine 1.53, sodium 139, potassium 3.7, GFR 53. AST, ALT, alkaline phosphatase, bilirubin normal. WBC 4.3, hemoglobin 13, platelets of 195. INR 1.   Cardiac catheterization done earlier today showed EF of 25% with normal coronaries. Also showed severe left-sided failure.   ASSESSMENT AND PLAN:  1.  Chronic atrial fibrillation, rate controlled. Presently the patient had cardiac catheter, normal coronaries. The plan is to cardiovert the patient. Will admit under observation. Continue his rate control medications. Anticoagulation has been held at this time for his catheterization. We will consult Dr. Welton Flakes with cardiology.  2.  Nonischemic cardiomyopathy. The patient is presently on IV fluids with bicarbonate drip per Dr. Milta Deiters orders along with normal saline after that.  Will watch him for any fluid overload, continue his Lasix.  3.  Hypertension. Continue home medications. We will hold Benicar as the patient just had contrast load for his catheter.  4.  Chronic systolic congestive heart failure. No signs of fluid overload at this time. Monitor for signs of fluid overload.  5.  Chronic kidney disease, stage III. Creatinine 1.53 is at baseline.  6.  Deep venous thrombosis prophylaxis with sequential compression devices.   CODE STATUS: FULL CODE.   TIME SPENT TODAY ON THIS CASE: 45 minutes.   ____________________________ Molinda Bailiff Jazlynn Nemetz, MD srs:np D: 03/16/2013 14:27:30  ET T: 03/16/2013 15:26:08 ET JOB#: [id number]  cc: Wardell Heath R. Mose Colaizzi, MD, <Dictator> Sheikh A. Ellsworth Lennox, MD Laurier Nancy, MD Orie Fisherman MD ELECTRONICALLY SIGNED 03/16/2013 18:43

## 2014-05-06 NOTE — Consult Note (Signed)
PATIENT NAME:  Gregory Chase, Gregory Chase MR#:  [id number] DATE OF BIRTH:  1965/07/07  DATE OF CONSULTATION:  07/21/2011  REFERRING PHYSICIAN:   CONSULTING PHYSICIAN:  Laurier Nancy, MD  HISTORY: This is a 49 year old African American male with a past medical history of nonischemic cardiomyopathy who was admitted with congestive heart failure. He says that he went to the Emergency Room because all of a sudden he started having shortness of breath, nausea and vomiting, and it seemed like all of a sudden he developed flash pulmonary edema. He was feeling fine up until two to three days ago, gradually got short of breath and then suddenly got very short of breath. At 11:00 p.m. he had to come to the Emergency Room. He is feeling much better now. He denies any chest pain, shortness of breath. I was asked to evaluate the patient because his troponin was 0.9 initially and the second one was 0.7. He has a past medical history of ejection fraction of 15%. Cardiac catheterization done in January 2012 showed normal coronaries. At that time he presented with pulmonary edema.   PAST MEDICAL HISTORY:    1. History of hypertension.  2. History of sleep apnea.  3. History of obesity.   FAMILY HISTORY: Positive for coronary artery disease.   SOCIAL HISTORY: Unremarkable.   PHYSICAL EXAMINATION:  GENERAL: He is alert, oriented x3, in no acute distress.   VITAL SIGNS: Stable.   NECK: No JVD.   LUNGS: Clear.   HEART: Regular rate and rhythm. Normal S1, S2. No audible murmur.   ABDOMEN: Soft, nontender, positive bowel sounds.   EXTREMITIES: No pedal edema.   NEUROLOGIC: Appears to be intact.   LABORATORY, RADIOLOGICAL AND DIAGNOSTIC DATA: EKG shows normal sinus rhythm, 81 beats per minute, occasional APCs, left axis deviation, and nonspecific ST-T changes. Troponin was as mentioned 0.9 and then 0.07. BNP was 1,138. BUN and creatinine were normal.   ASSESSMENT AND PLAN: Congestive heart failure  secondary to severe LV dysfunction with nonischemic cardiomyopathy due to hypertensive heart disease. He already had a cardiac catheterization last year that was unremarkable. Advised readjusting the medications. He is already feeling much better. He is already on digoxin and carvedilol 50 b.i.d., aspirin, Lasix 40 q.6 right now and spironolactone. He was unable to tolerate ACE inhibitors in the past and has been on Benicar 40 mg. His medications look good. Continue Lasix until he feels better and we will discharge him with follow-up in the office. Does not need intervention right now.    ____________________________ Laurier Nancy, MD sak:ap D: 07/21/2011 13:49:27 ET T: 07/21/2011 14:01:24 ET JOB#: [id number]  cc: Laurier Nancy, MD, <Dictator> Laurier Nancy MD ELECTRONICALLY SIGNED 07/30/2011 13:13

## 2014-05-06 NOTE — H&P (Signed)
PATIENT NAME:  Gregory Chase, Gregory Chase MR#:  [id number] DATE OF BIRTH:  06/20/1965  DATE OF ADMISSION:  07/21/2011  PRIMARY CARE PHYSICIAN: Dr. Adrian Blackwater    CHIEF COMPLAINT: Sudden shortness of breath.   HISTORY OF PRESENT ILLNESS: Gregory Chase is a 49 year old pleasant African American male with history of nonischemic cardiomyopathy who was in his usual state of health until about midnight when he had sudden shortness of breath while he was sleeping. The patient denies having any chest pain. He was transported to the Emergency Department and was extremely short of breath. His chest x-ray showed pulmonary edema. It is worth to mention that the patient had prior admissions here with sudden flash pulmonary edema. The patient received intravenous Lasix and oxygen supplementation along with nitro paste following which he started to improve and now he feels much better. The patient was admitted for further evaluation and treatment. He indicates that he was compliant on taking his medications every day. In fact, at 11 p.m. he took all his medications. He also indicates he does not add any salt to his diet and he is restricting his fluid intake.   REVIEW OF SYSTEMS: CONSTITUTIONAL: Denies any fever. No chills. No fatigue. EYES: No blurring of vision. No double vision. ENT: No hearing impairment. No sore throat. No dysphagia. CARDIOVASCULAR: No chest pain but reports the sudden shortness of breath. No edema. No syncope. RESPIRATORY: Extreme shortness of breath which is now improving. No cough. No sputum production. No chest pain. GASTROINTESTINAL: No abdominal pain. No vomiting. No diarrhea. GENITOURINARY: No dysuria. No frequency of urination. MUSCULOSKELETAL: No joint pain or swelling. No muscular pain or swelling. INTEGUMENTARY: No skin rash. No ulcers. NEUROLOGY: No focal weakness. No seizure activity. No headache. PSYCHIATRY: No anxiety. No depression. ENDOCRINE: No polyuria or polydipsia. No heat or cold  intolerance.   PAST MEDICAL HISTORY:  1. History of nonischemic cardiomyopathy. His ejection fraction by cardiac cath was 15%. His cardiac cath was in January of 2012 showing normal coronaries. The patient has a prior history of presentation to the hospital with flash pulmonary edema.  2. Systemic hypertension. Often his troponin is slightly elevated.   3. Obstructive sleep apnea syndrome and could not tolerate CPAP treatment.  4. Obesity.   FAMILY HISTORY: Mother died at age of 4. She had coronary artery disease. His father has diabetes. He has a sister who has hypertension.   SOCIAL HABITS: Nonsmoker. No history of alcohol or drug abuse.   SOCIAL HISTORY: The patient is married, living with his wife. 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   ADMISSION MEDICATIONS:  1. Spironolactone or Aldactone 25 mg once a day. 2. Lasix 40 mg twice a day. 3. Digoxin 0.125 mg once a day.  4. Carvedilol 50 mg twice a day. 5. Benicar 40 mg once a day.   ALLERGIES: ACE inhibitor causes cough and bee stings. Although in the medical records reported that he is allergic to aspirin but he is not. He is trying to avoid it because somebody told him that aspirin causes gout.   PHYSICAL EXAMINATION:   VITAL SIGNS: Blood pressure 137/95, respiratory rate 24, pulse 76, temperature 98.8, oxygen saturation 98% while he is on oxygen.   GENERAL APPEARANCE: Middle-aged male sitting at the bedside in no acute distress at the time of my examination.   HEAD: No pallor. No icterus. No cyanosis.  EARS, NOSE, AND THROAT: Hearing was normal. Nasal mucosa, lips, tongue were normal.   EYES:  Normal iris and conjunctivae. Pupils about 5 mm, equal and reactive to light.   NECK: Supple. Trachea at midline. No thyromegaly. No cervical lymphadenopathy. No masses.   HEART: Normal S1, S2. No S3, S4. No murmur. No gallop. No carotid bruits.   RESPIRATORY: Normal breathing pattern without use of accessory  muscles. No rales. No wheezing. Again, the patient had responded to the Lasix and diuresed well. His lungs are clear by the time of my encounter.   ABDOMEN: Obese, soft without tenderness. No hepatosplenomegaly. No masses. No hernias.   SKIN: No ulcers. No subcutaneous nodules.   MUSCULOSKELETAL: No joint swelling. No clubbing.   NEUROLOGIC: Cranial nerves II through XII are intact. No focal motor deficit.   PSYCHIATRY: The patient is alert and oriented x3. Mood and affect were normal.   LABORATORY, DIAGNOSTIC, AND RADIOLOGICAL DATA: Chest x-ray showed cardiomegaly and pulmonary edema.   EKG showed normal sinus rhythm at rate of 80 per minute with frequent premature atrial complexes. 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 Nonspecific T wave abnormalities in the lateral leads.   His BNP was elevated at 1138. Glucose 141, BUN 16, creatinine 1.4, sodium 141, potassium 3.8. Estimated GFR more than 60. Normal liver function tests. CK was normal at 221. Troponin was slightly elevated at 0.09. In the past his troponin has ranged between 0.08 to 0.24. CBC showed white count 6000, hemoglobin 15, hematocrit 47, platelet count 238.   ASSESSMENT:  1. Acute flash pulmonary edema. 2. Acute systolic heart failure on chronic systolic heart failure.  3. Nonischemic congestive cardiomyopathy with ejection fraction of 15% by cardiac cath in January 2012. 4. Acute respiratory failure secondary to pulmonary edema.  5. Systemic hypertension.  6. Mild elevation of troponin. Elevated troponin is usual for him on prior admissions.  7. Obstructive sleep apnea syndrome.   PLAN:  1. Will continue intravenous diuresis for additional three doses of intravenous Lasix.  2. Continue ACE or receptor blocker using Benicar.  3. Continue Aldactone.  4. Blood pressure control.  5. Fluid and salt restriction.  6. Daily body weight.  7. I will follow-up on his troponin. In the interim I will place him  on aspirin 325 mg a day. It may not be necessary if troponin remains the same level or lower.   TIME SPENT EVALUATING THIS PATIENT: More than 55 minutes.   ____________________________ Carney Corners. Rudene Re, MD amd:drc D: 07/21/2011 03:40:41 ET T: 07/21/2011 07:37:55 ET JOB#: [id number]  cc: Carney Corners. Rudene Re, MD, <Dictator> Laurier Nancy, MD Karolee Ohs Dala Dock MD ELECTRONICALLY SIGNED 07/23/2011 1:35

## 2014-05-06 NOTE — Discharge Summary (Signed)
ewPATIENT NAME:  Gregory Chase, Gregory Chase MR#:  [id number] DATE OF BIRTH:  Jun 09, 1965  DATE OF ADMISSION:  07/21/2011 DATE OF DISCHARGE:  07/22/2011  DISCHARGE DIAGNOSIS: Acute on chronic systolic congestive heart failure.   SECONDARY DIAGNOSES:  1. Nonischemic cardiomyopathy.  2. Morbid obesity.   DISCHARGE MEDICATIONS: The patient is to resume home medications.   CONSULTATION: Cardiology, Dr. Adrian Blackwater   PROCEDURES: None.   HOSPITAL COURSE: This African American gentleman was admitted through the Emergency Room with of acute onset of shortness of breath. Clinical evaluation in the Emergency Room revealed acute pulmonary edema from decompensated congestive heart failure. Please refer to the history and physical for full details. The patient was admitted to the monitored floor. His initial troponin was slightly elevated at 0.09 and plateaued at 0.07. He has a history of chronically elevated troponins. His congestive heart failure was managed with increased dose of intravenous diuretics which he tolerated well resulting in a four pound weight loss in 24 hours and resolution of his dyspnea. On physical exam his rales had resolved and he is being discharged to home in a stable condition. Cardiology consultation did recommend a cardiac cath at this time due to his history of chronically elevated troponins and normal coronary arteries in 2012 on left heart cath. The patient is being discharged to home in satisfactory condition.   DISCHARGE MEDICATIONS: No changes were made in the patient'Kimberlyann Hollar medications.   DISCHARGE INSTRUCTIONS: The patient was advised to avoid high sodium intake which was most likely the cause of his decompensated CHF.   ACTIVITY: As tolerated.   FOLLOW-UP: Follow-up in 1 to 2 weeks.   The patient is excused from work for the next two business days.   DISCHARGE PROCESS TIME SPENT: 36 minutes.   ____________________________ Silas Flood Ellsworth Lennox, MD sat:drc D: 07/22/2011  09:23:40 ET T: 07/22/2011 14:03:22 ET JOB#: [id number]  cc: Sheikh A. Ellsworth Lennox, MD, <Dictator> Charlesetta Garibaldi MD ELECTRONICALLY SIGNED 09/02/2011 12:46

## 2014-10-30 ENCOUNTER — Inpatient Hospital Stay
Admit: 2014-10-30 | Discharge: 2014-10-30 | Disposition: A | Discharge status: Home / Self Care | Payer: PRIVATE HEALTH INSURANCE | Attending: Physician Assistant | Admitting: Physician Assistant

## 2014-10-30 ENCOUNTER — Encounter: Payer: Self-pay | Admitting: Emergency Medicine

## 2014-10-30 ENCOUNTER — Emergency Department: Payer: PRIVATE HEALTH INSURANCE

## 2014-10-30 ENCOUNTER — Inpatient Hospital Stay
Admission: EM | Admit: 2014-10-30 | Discharge: 2014-11-01 | DRG: 291 | Disposition: A | Discharge status: Home / Self Care | Payer: PRIVATE HEALTH INSURANCE | Attending: Internal Medicine | Admitting: Internal Medicine

## 2014-10-30 ENCOUNTER — Other Ambulatory Visit: Payer: Self-pay | Admitting: Emergency Medicine

## 2014-10-30 DIAGNOSIS — I447 Left bundle-branch block, unspecified: Secondary | ICD-10-CM | POA: Diagnosis present

## 2014-10-30 DIAGNOSIS — R400 Somnolence: Secondary | ICD-10-CM

## 2014-10-30 DIAGNOSIS — Z79899 Other long term (current) drug therapy: Secondary | ICD-10-CM

## 2014-10-30 DIAGNOSIS — J9601 Acute respiratory failure with hypoxia: Secondary | ICD-10-CM

## 2014-10-30 DIAGNOSIS — I130 Hypertensive heart and chronic kidney disease with heart failure and stage 1 through stage 4 chronic kidney disease, or unspecified chronic kidney disease: Principal | ICD-10-CM | POA: Diagnosis present

## 2014-10-30 DIAGNOSIS — E669 Obesity, unspecified: Secondary | ICD-10-CM | POA: Diagnosis present

## 2014-10-30 DIAGNOSIS — G4733 Obstructive sleep apnea (adult) (pediatric): Secondary | ICD-10-CM | POA: Diagnosis present

## 2014-10-30 DIAGNOSIS — I5023 Acute on chronic systolic (congestive) heart failure: Secondary | ICD-10-CM | POA: Diagnosis present

## 2014-10-30 DIAGNOSIS — N183 Chronic kidney disease, stage 3 unspecified: Secondary | ICD-10-CM

## 2014-10-30 DIAGNOSIS — Z8249 Family history of ischemic heart disease and other diseases of the circulatory system: Secondary | ICD-10-CM

## 2014-10-30 DIAGNOSIS — I10 Essential (primary) hypertension: Secondary | ICD-10-CM

## 2014-10-30 DIAGNOSIS — I4891 Unspecified atrial fibrillation: Secondary | ICD-10-CM | POA: Diagnosis present

## 2014-10-30 DIAGNOSIS — Z833 Family history of diabetes mellitus: Secondary | ICD-10-CM

## 2014-10-30 DIAGNOSIS — K5900 Constipation, unspecified: Secondary | ICD-10-CM | POA: Diagnosis present

## 2014-10-30 DIAGNOSIS — Z6838 Body mass index (BMI) 38.0-38.9, adult: Secondary | ICD-10-CM

## 2014-10-30 DIAGNOSIS — I420 Dilated cardiomyopathy: Secondary | ICD-10-CM | POA: Diagnosis present

## 2014-10-30 DIAGNOSIS — R1032 Left lower quadrant pain: Secondary | ICD-10-CM

## 2014-10-30 HISTORY — DX: Heart failure, unspecified: I50.9

## 2014-10-30 LAB — PROTIME-INR
INR: 1.08
Prothrombin Time: 14.2 seconds (ref 11.4–15.0)

## 2014-10-30 LAB — COMPREHENSIVE METABOLIC PANEL
ALBUMIN: 3.7 g/dL (ref 3.5–5.0)
ALK PHOS: 76 U/L (ref 38–126)
ALT: 22 U/L (ref 17–63)
AST: 25 U/L (ref 15–41)
Anion gap: 7 (ref 5–15)
BUN: 19 mg/dL (ref 6–20)
CALCIUM: 8.6 mg/dL — AB (ref 8.9–10.3)
CHLORIDE: 107 mmol/L (ref 101–111)
CO2: 27 mmol/L (ref 22–32)
Creatinine, Ser: 1.61 mg/dL — ABNORMAL HIGH (ref 0.61–1.24)
GFR calc Af Amer: 56 mL/min — ABNORMAL LOW (ref >60)
GFR calc non Af Amer: 48 mL/min — ABNORMAL LOW (ref >60)
GLUCOSE: 86 mg/dL (ref 65–99)
Potassium: 3.1 mmol/L — ABNORMAL LOW (ref 3.5–5.1)
SODIUM: 141 mmol/L (ref 135–145)
Total Bilirubin: 1 mg/dL (ref 0.3–1.2)
Total Protein: 6.8 g/dL (ref 6.5–8.1)

## 2014-10-30 LAB — MRSA PCR SCREENING: MRSA by PCR: NEGATIVE

## 2014-10-30 LAB — HEMOGLOBIN A1C: HEMOGLOBIN A1C: 5.7 % (ref 4.0–6.0)

## 2014-10-30 LAB — BRAIN NATRIURETIC PEPTIDE: B Natriuretic Peptide: 678 pg/mL — ABNORMAL HIGH (ref 0.0–100.0)

## 2014-10-30 LAB — DIGOXIN LEVEL
DIGOXIN LVL: 0.3 ng/mL — AB (ref 0.8–2.0)
Digoxin Level: 0.2 ng/mL — ABNORMAL LOW (ref 0.8–2.0)

## 2014-10-30 LAB — CBC
HEMATOCRIT: 47.4 % (ref 40.0–52.0)
Hemoglobin: 15.3 g/dL (ref 13.0–18.0)
MCH: 26.4 pg (ref 26.0–34.0)
MCHC: 32.3 g/dL (ref 32.0–36.0)
MCV: 81.8 fL (ref 80.0–100.0)
PLATELETS: 237 10*3/uL (ref 150–440)
RBC: 5.8 MIL/uL (ref 4.40–5.90)
RDW: 16.8 % — ABNORMAL HIGH (ref 11.5–14.5)
WBC: 10.7 10*3/uL — AB (ref 3.8–10.6)

## 2014-10-30 LAB — TROPONIN I
TROPONIN I: 0.13 ng/mL — AB (ref <0.031)
Troponin I: 0.1 ng/mL — ABNORMAL HIGH (ref <0.031)
Troponin I: 0.14 ng/mL — ABNORMAL HIGH (ref <0.031)

## 2014-10-30 LAB — APTT: APTT: 30 s (ref 24–36)

## 2014-10-30 LAB — TSH: TSH: 0.412 u[IU]/mL (ref 0.350–4.500)

## 2014-10-30 LAB — HEPARIN LEVEL (UNFRACTIONATED): Heparin Unfractionated: 0.21 IU/mL — ABNORMAL LOW (ref 0.30–0.70)

## 2014-10-30 MED ORDER — SODIUM CHLORIDE 0.9 % IJ SOLN
3.0000 mL | Freq: Two times a day (BID) | INTRAMUSCULAR | Status: DC
  Administered 2014-10-30 – 2014-11-01 (×5): 3 mL via INTRAVENOUS

## 2014-10-30 MED ORDER — ASPIRIN EC 325 MG PO TBEC
325.0000 mg | DELAYED_RELEASE_TABLET | Freq: Every day | ORAL | Status: DC
  Administered 2014-10-30 – 2014-10-31 (×2): 325 mg via ORAL
  Filled 2014-10-30 (×3): qty 1

## 2014-10-30 MED ORDER — HEPARIN (PORCINE) IN NACL 100-0.45 UNIT/ML-% IJ SOLN
14.0000 [IU]/kg/h | INTRAMUSCULAR | Status: DC
  Administered 2014-10-30: 14 [IU]/kg/h via INTRAVENOUS
  Filled 2014-10-30 (×2): qty 250

## 2014-10-30 MED ORDER — NITROGLYCERIN 2 % TD OINT: 1.0000 [in_us] | TOPICAL_OINTMENT | Freq: Four times a day (QID) | TRANSDERMAL | Status: DC

## 2014-10-30 MED ORDER — POLYETHYLENE GLYCOL 3350 17 G PO PACK
17.0000 g | PACK | Freq: Every day | ORAL | Status: DC | PRN
Start: 2014-10-30 — End: 2014-11-01

## 2014-10-30 MED ORDER — BISACODYL 10 MG RE SUPP: 10.0000 mg | Freq: Every day | RECTAL | Status: DC | PRN

## 2014-10-30 MED ORDER — ACETAMINOPHEN 650 MG RE SUPP: 650.0000 mg | Freq: Four times a day (QID) | RECTAL | Status: DC | PRN

## 2014-10-30 MED ORDER — DIGOXIN 125 MCG PO TABS
0.1250 mg | ORAL_TABLET | Freq: Every day | ORAL | Status: DC
  Administered 2014-10-30: 0.125 mg via ORAL
  Filled 2014-10-30 (×2): qty 1

## 2014-10-30 MED ORDER — POTASSIUM CHLORIDE CRYS ER 20 MEQ PO TBCR
20.0000 meq | EXTENDED_RELEASE_TABLET | Freq: Two times a day (BID) | ORAL | Status: AC
Start: 2014-10-30 — End: 2014-10-30
  Administered 2014-10-30 (×2): 20 meq via ORAL
  Filled 2014-10-30 (×2): qty 1

## 2014-10-30 MED ORDER — ONDANSETRON HCL 4 MG/2ML IJ SOLN: 4.0000 mg | Freq: Four times a day (QID) | INTRAMUSCULAR | Status: DC | PRN

## 2014-10-30 MED ORDER — AMIODARONE HCL 200 MG PO TABS
400.0000 mg | ORAL_TABLET | Freq: Every day | ORAL | Status: DC
  Administered 2014-10-30: 400 mg via ORAL
  Filled 2014-10-30 (×2): qty 2

## 2014-10-30 MED ORDER — SODIUM CHLORIDE 0.9 % IJ SOLN: 3.0000 mL | INTRAMUSCULAR | Status: DC | PRN

## 2014-10-30 MED ORDER — HEPARIN SODIUM (PORCINE) 5000 UNIT/ML IJ SOLN
5000.0000 [IU] | Freq: Three times a day (TID) | INTRAMUSCULAR | Status: DC
  Administered 2014-10-30 – 2014-10-31 (×2): 5000 [IU] via SUBCUTANEOUS
  Filled 2014-10-30 (×2): qty 1

## 2014-10-30 MED ORDER — FUROSEMIDE 10 MG/ML IJ SOLN
40.0000 mg | Freq: Once | INTRAMUSCULAR | Status: AC
  Administered 2014-10-30: 40 mg via INTRAVENOUS
  Filled 2014-10-30: qty 4

## 2014-10-30 MED ORDER — CLONIDINE HCL 0.1 MG PO TABS
0.1000 mg | ORAL_TABLET | Freq: Two times a day (BID) | ORAL | Status: DC
  Administered 2014-10-30 (×2): 0.1 mg via ORAL
  Filled 2014-10-30 (×3): qty 1

## 2014-10-30 MED ORDER — IPRATROPIUM-ALBUTEROL 0.5-2.5 (3) MG/3ML IN SOLN
RESPIRATORY_TRACT | Status: AC
  Administered 2014-10-30: 06:00:00
  Filled 2014-10-30: qty 3

## 2014-10-30 MED ORDER — ROSUVASTATIN CALCIUM 5 MG PO TABS
5.0000 mg | ORAL_TABLET | ORAL | Status: DC
  Administered 2014-10-30 – 2014-11-01 (×2): 5 mg via ORAL
  Filled 2014-10-30 (×2): qty 1

## 2014-10-30 MED ORDER — NITROGLYCERIN 0.4 MG SL SUBL
SUBLINGUAL_TABLET | SUBLINGUAL | Status: AC
  Administered 2014-10-30: 0.4 mg via SUBLINGUAL
  Filled 2014-10-30: qty 3

## 2014-10-30 MED ORDER — POTASSIUM CHLORIDE CRYS ER 20 MEQ PO TBCR
40.0000 meq | EXTENDED_RELEASE_TABLET | Freq: Two times a day (BID) | ORAL | Status: DC
  Administered 2014-10-30 (×2): 40 meq via ORAL
  Filled 2014-10-30 (×2): qty 2

## 2014-10-30 MED ORDER — NITROGLYCERIN 0.4 MG SL SUBL
0.4000 mg | SUBLINGUAL_TABLET | SUBLINGUAL | Status: DC | PRN
  Administered 2014-10-30: 0.4 mg via SUBLINGUAL

## 2014-10-30 MED ORDER — IRBESARTAN 75 MG PO TABS
300.0000 mg | ORAL_TABLET | Freq: Every day | ORAL | Status: DC
  Administered 2014-10-30: 300 mg via ORAL
  Filled 2014-10-30: qty 4

## 2014-10-30 MED ORDER — SODIUM CHLORIDE 0.9 % IV SOLN: 250.0000 mL | INTRAVENOUS | Status: DC | PRN

## 2014-10-30 MED ORDER — ACETAMINOPHEN 325 MG PO TABS
650.0000 mg | ORAL_TABLET | Freq: Four times a day (QID) | ORAL | Status: DC | PRN
Start: 2014-10-30 — End: 2014-11-01
  Administered 2014-10-31 – 2014-11-01 (×5): 650 mg via ORAL
  Filled 2014-10-30 (×3): qty 2
  Filled 2014-10-30: qty 1
  Filled 2014-10-30 (×2): qty 2

## 2014-10-30 MED ORDER — FUROSEMIDE 10 MG/ML IJ SOLN
40.0000 mg | Freq: Two times a day (BID) | INTRAMUSCULAR | Status: DC
  Administered 2014-10-30 (×2): 40 mg via INTRAVENOUS
  Filled 2014-10-30 (×3): qty 4

## 2014-10-30 MED ORDER — HEPARIN BOLUS VIA INFUSION
4000.0000 [IU] | Freq: Once | INTRAVENOUS | Status: AC
  Administered 2014-10-30: 4000 [IU] via INTRAVENOUS
  Filled 2014-10-30: qty 4000

## 2014-10-30 MED ORDER — NITROGLYCERIN 2 % TD OINT
1.0000 [in_us] | TOPICAL_OINTMENT | Freq: Four times a day (QID) | TRANSDERMAL | Status: DC
  Administered 2014-10-30 – 2014-11-01 (×8): 1 [in_us] via TOPICAL
  Filled 2014-10-30 (×9): qty 1

## 2014-10-30 MED ORDER — ONDANSETRON HCL 4 MG PO TABS: 4.0000 mg | ORAL_TABLET | Freq: Four times a day (QID) | ORAL | Status: DC | PRN

## 2014-10-30 MED ORDER — CARVEDILOL 25 MG PO TABS
50.0000 mg | ORAL_TABLET | Freq: Two times a day (BID) | ORAL | Status: DC
  Administered 2014-10-30 – 2014-10-31 (×2): 50 mg via ORAL
  Filled 2014-10-30 (×2): qty 2

## 2014-10-30 MED ORDER — SODIUM CHLORIDE 0.9 % IJ SOLN
3.0000 mL | Freq: Two times a day (BID) | INTRAMUSCULAR | Status: DC
  Administered 2014-10-30 – 2014-10-31 (×2): 3 mL via INTRAVENOUS

## 2014-10-30 NOTE — ED Provider Notes (Signed)
[facility] Emergency Department Provider Note  ____________________________________________  Time seen: Approximately 515 AM  I have reviewed the triage vital signs and the nursing notes.   HISTORY  Chief Complaint Shortness of Breath  History Limited by acute respiratory distress.  HPI Gregory Chase is a 49 y.o. male who started having shortness of breath at 42 PM. The patient couldn't breathe and couldn't tolerate the fact that he couldn't breathe so he called EMS this morning. EMS reports that when they got there his vision saturation was 84% on room air. They report that he sounded like he was gurgling can see was full of fluid. The patient was put on BiPAP and they heard rales on auscultation. The patient reports that he hadn't felt well for a few days but he reports that he wishes having difficulty breathing for a few days. The patient otherwise is in some severe distress and is unable to participate fully in history.The patient denies any chest pain he denies any abdominal pain he denies any nausea or vomiting. Patient has had similar episodes in the past.   Past Medical History  Diagnosis Date  . CHF (congestive heart failure) (HCC)     There are no active problems to display for this patient.   History reviewed. No pertinent past surgical history.  Current Outpatient Rx  Name  Route  Sig  Dispense  Refill  . amiodarone (PACERONE) 400 MG tablet   Oral   Take 400 mg by mouth daily.         . carvedilol (COREG) 25 MG tablet   Oral   Take 50 mg by mouth 2 (two) times daily with a meal.         . cloNIDine (CATAPRES) 0.1 MG tablet   Oral   Take 0.1 mg by mouth 2 (two) times daily.         . digoxin (LANOXIN) 0.125 MG tablet   Oral   Take 0.125 mg by mouth daily.         . furosemide (LASIX) 40 MG tablet   Oral   Take 40 mg by mouth 2 (two) times daily.         Marland Kitchen olmesartan (BENICAR) 40 MG tablet   Oral   Take 40 mg  by mouth daily.         . rosuvastatin (CRESTOR) 5 MG tablet   Oral   Take 5 mg by mouth every other day.           Allergies Review of patient's allergies indicates no known allergies.  History reviewed. No pertinent family history.  Social History Social History  Substance Use Topics  . Smoking status: Never Smoker   . Smokeless tobacco: None  . Alcohol Use: No    Review of Systems Constitutional: No fever/chills Eyes: No visual changes. ENT: No sore throat. Cardiovascular: Denies chest pain. Respiratory: shortness of breath. Gastrointestinal: No abdominal pain.  No nausea, no vomiting.   Genitourinary: Negative for dysuria. Musculoskeletal: Negative for back pain. Skin: Negative for rash. Neurological: Negative for headaches, focal weakness or numbness.  10-point ROS otherwise negative.  ____________________________________________   PHYSICAL EXAM:  VITAL SIGNS: ED Triage Vitals  Enc Vitals Group     BP 10/30/14 0600 174/117 mmHg     Pulse Rate 10/30/14 0526 112     Resp 10/30/14 0526 41     Temp 10/30/14 0526 98.1 F (36.7 C)     Temp Source 10/30/14 0526 Oral  SpO2 10/30/14 0526 100 %     Weight 10/30/14 0526 285 lb (129.275 kg)     Height 10/30/14 0526 6' (1.829 m)     Head Cir --      Peak Flow --      Pain Score 10/30/14 0531 0     Pain Loc --      Pain Edu? --      Excl. in GC? --     Constitutional: Alert and oriented. Patient in severe distress Eyes: Conjunctivae are normal. PERRL. EOMI. Head: Atraumatic. Nose: No congestion/rhinnorhea. Mouth/Throat: Mucous membranes are moist.  Oropharynx non-erythematous. Cardiovascular: Normal rate, regular rhythm. Grossly normal heart sounds.  Good peripheral circulation. Respiratory: Rales throughout all lung fields with prolonged expiratory phase. Increased respiratory distress and tachypnea. Gastrointestinal: Soft and nontender. No distention. Positive bowel sounds Musculoskeletal: No lower  extremity tenderness nor edema.  No joint effusions. Neurologic:  Normal speech and language. No gross focal neurologic deficits are appreciated. No gait instability. Skin:  Skin is warm, dry and intact. No rash noted. Psychiatric: Mood and affect are normal. Speech and behavior are normal.  ____________________________________________   LABS (all labs ordered are listed, but only abnormal results are displayed)  Labs Reviewed  CBC - Abnormal; Notable for the following:    WBC 10.7 (*)    RDW 16.8 (*)    All other components within normal limits  DIGOXIN LEVEL - Abnormal; Notable for the following:    Digoxin Level 0.2 (*)    All other components within normal limits  BRAIN NATRIURETIC PEPTIDE - Abnormal; Notable for the following:    B Natriuretic Peptide 678.0 (*)    All other components within normal limits  COMPREHENSIVE METABOLIC PANEL  TROPONIN I   ____________________________________________  EKG  ED ECG REPORT I, Rebecka Apley, the attending physician, personally viewed and interpreted this ECG.   Date: 10/30/2014  EKG Time: 528  Rate: 112  Rhythm: sinus tachycardia, LBBB  Axis: Axis  Intervals:left bundle branch block  ST&T Change: Left bundle branch block  ____________________________________________  RADIOLOGY  Chest X-ray: Vascular congestion and cardiomegaly with increased interstitial markings concerning for pulmonary edema. ____________________________________________   PROCEDURES  Procedure(s) performed: None  Critical Care performed: Yes, see critical care note(s)  CRITICAL CARE Performed by: Lucrezia Europe P   Total critical care time: 45 minutes  Critical care time was exclusive of separately billable procedures and treating other patients.  Critical care was necessary to treat or prevent imminent or life-threatening deterioration.  Critical care was time spent personally by me on the following activities: development of  treatment plan with patient and/or surrogate as well as nursing, discussions with consultants, evaluation of patient's response to treatment, examination of patient, obtaining history from patient or surrogate, ordering and performing treatments and interventions, ordering and review of laboratory studies, ordering and review of radiographic studies, pulse oximetry and re-evaluation of patient's condition.  ____________________________________________   INITIAL IMPRESSION / ASSESSMENT AND PLAN / ED COURSE  Pertinent labs & imaging results that were available during my care of the patient were reviewed by me and considered in my medical decision making (see chart for details).  This is a 49 year old male with a history of CHF who comes in with acute CHF exacerbation today. The patient was in some severe respiratory distress when he arrives we will place him on BiPAP immediately. The patient was also diaphoretic although he denied chest pain. We did give the patient some nitroglycerin  sublingually as well as a Nitropaste to his chest. Also give the patient 40 of Lasix IV. As the patient's blood pressure decreased his respiratory rate improved on the BiPAP. I gave the patient DuoNeb as he did have some prolonged expiratory phase and reports that he was sick earlier this week. The patient does have a left bundle branch block which we do not have an example of an old left bundle but given his history of cardiomegaly and CHF as well as his severe respiratory distress feel that we need to stabilize the patient's breathing and tachycardia and reassess the EKG. The patient continues to endorse that he does not have any chest pain at this time.  The patient's breathing did improve and I contacted Dr. Elveria Rising the patient's cardiologist who recommends starting the patient on heparin. I will admit the patient to the hospital service for further evaluation and treatment of his CHF  exacerbation. ____________________________________________   FINAL CLINICAL IMPRESSION(S) / ED DIAGNOSES  Final diagnoses:  Acute on chronic systolic congestive heart failure (HCC)      Rebecka Apley, MD 10/30/14 [phone]

## 2014-10-30 NOTE — Consult Note (Signed)
Primary Cardiologist: Dr. Adrian Blackwater    Reason for Consultation : Acute on Chronic CHF, abnormal EKG    HPI : This is a 49yoM well known to our practice with PMHx systolic CHF (last echo 10/15 EF 50%), HTN, a-fib, OSA who presented to ER last night c/o sudden onset of SOB. He denies CP, pressure, tightness, or palpitations. O2 sats found to be in 80s, placed on BiPAP, currently feeling much better. He denies recent pedal edema, orthopnea, or PND and states he has been compliant with all cardiac medications.         Review of Systems: General: negative for chills, fever, night sweats or weight changes.  Cardiovascular: negative for chest pain, edema, orthopnea, palpitations, paroxysmal nocturnal dyspnea, positive for shortness of breath or dyspnea on exertion Dermatological: negative for rash Respiratory: negative for cough or wheezing Urologic: negative for hematuria Abdominal: negative for nausea, vomiting, diarrhea, bright red blood per rectum, melena, or hematemesis Neurologic: negative for visual changes, syncope, or dizziness All other systems reviewed and are otherwise negative except as noted above.    Past Medical History  Diagnosis Date  . CHF (congestive heart failure) (HCC)     Medications Prior to Admission  Medication Sig Dispense Refill  . amiodarone (PACERONE) 400 MG tablet Take 400 mg by mouth daily.    . carvedilol (COREG) 25 MG tablet Take 50 mg by mouth 2 (two) times daily with a meal.    . cloNIDine (CATAPRES) 0.1 MG tablet Take 0.1 mg by mouth 2 (two) times daily.    . digoxin (LANOXIN) 0.125 MG tablet Take 0.125 mg by mouth daily.    . furosemide (LASIX) 40 MG tablet Take 40 mg by mouth 2 (two) times daily.    Marland Kitchen olmesartan (BENICAR) 40 MG tablet Take 40 mg by mouth daily.    . rosuvastatin (CRESTOR) 5 MG tablet Take 5 mg by mouth every other day.       . nitroGLYCERIN  1 inch Topical 4 times per day    Infusions:    No Known  Allergies  Social History   Social History  . Marital Status: Married    Spouse Name: N/A  . Number of Children: N/A  . Years of Education: N/A   Occupational History  . Not on file.   Social History Main Topics  . Smoking status: Never Smoker   . Smokeless tobacco: Not on file  . Alcohol Use: No  . Drug Use: No  . Sexual Activity: Yes   Other Topics Concern  . Not on file   Social History Narrative  . No narrative on file    History reviewed. No pertinent family history.  PHYSICAL EXAM: Filed Vitals:   10/30/14 0900  BP: 144/98  Pulse: 78  Temp: 98.9 F (37.2 C)  Resp: 16    No intake or output data in the 24 hours ending 10/30/14 0914  General:  Well appearing. mild respiratory difficulty, o2 Fowler in place  HEENT: normal Neck: supple. no JVD. Carotids 2+ bilat; no bruits. No lymphadenopathy or thryomegaly appreciated. Cor: PMI nondisplaced. Regular rate & rhythm. No rubs, gallops or murmurs. Lungs: crackles appreciated on RLL Abdomen: soft, nontender, nondistended. No hepatosplenomegaly. No bruits or masses. Good bowel sounds. Extremities: no cyanosis, clubbing, rash, edema Neuro: alert & oriented x 3, cranial nerves grossly intact. moves all 4 extremities w/o difficulty. Affect pleasant.  ECG: sinus tachycardia 112 BPM LBBB  Results for orders placed or performed during  the hospital encounter of 10/30/14 (from the past 24 hour(s))  CBC     Status: Abnormal   Collection Time: 10/30/14  5:27 AM  Result Value Ref Range   WBC 10.7 (H) 3.8 - 10.6 K/uL   RBC 5.80 4.40 - 5.90 MIL/uL   Hemoglobin 15.3 13.0 - 18.0 g/dL   HCT 16.1 09.6 - 04.5 %   MCV 81.8 80.0 - 100.0 fL   MCH 26.4 26.0 - 34.0 pg   MCHC 32.3 32.0 - 36.0 g/dL   RDW 40.9 (H) 81.1 - 91.4 %   Platelets 237 150 - 440 K/uL  Digoxin level     Status: Abnormal   Collection Time: 10/30/14  5:27 AM  Result Value Ref Range   Digoxin Level 0.2 (L) 0.8 - 2.0 ng/mL  Brain natriuretic peptide     Status:  Abnormal   Collection Time: 10/30/14  6:31 AM  Result Value Ref Range   B Natriuretic Peptide 678.0 (H) 0.0 - 100.0 pg/mL   Dg Chest Portable 1 View  10/30/2014  CLINICAL DATA:  Acute onset of shortness of breath, with adventitious breath sounds. Initial encounter. EXAM: PORTABLE CHEST 1 VIEW COMPARISON:  Chest radiograph performed 10/01/2012 FINDINGS: The lungs are well-aerated. Vascular congestion is noted, with increased interstitial markings, concerning for pulmonary edema. There is no evidence of pleural effusion or pneumothorax. The cardiomediastinal silhouette is enlarged. No acute osseous abnormalities are seen. IMPRESSION: Vascular congestion and cardiomegaly, with increased interstitial markings, concerning for pulmonary edema. Electronically Signed   By: Roanna Raider M.D.   On: 10/30/2014 06:12     ASSESSMENT: Acute on chronic systolic CHF    PLAN/DISCUSSION: Agree with diuresis and continuation of home cardiac medications. Await echo results for further recs. Of note, pt has had two cardiac catheterizations (2012 and 2015) with normal coronaries.    Patient and plan discussed with supervising provider, Dr. Adrian Blackwater, who agrees with above findings.   Alinda Sierras Margarito Courser [facility] 10/30/2014 9:14 AM

## 2014-10-30 NOTE — ED Notes (Signed)
Lab called with a critical high level: Troponin of 0.04. Dr. Zenda Alpers was notified.

## 2014-10-30 NOTE — Progress Notes (Signed)
Since LVEF,is 18%,probably entresto and aldacone and lifevest needed. Elevated troponin due to CHF and heparin can be stopped.

## 2014-10-30 NOTE — ED Notes (Signed)
Pt arived to the ED for complaints of shortness of breath with adventitious breath sounds. Pt arrived to the ED on BIPAP and has a hx of CHF. Dr. Zenda Alpers at bedside upon arrival to ED.

## 2014-10-30 NOTE — Progress Notes (Deleted)
ANTICOAGULATION CONSULT NOTE - Initial Consult  Pharmacy Consult for Heparin Indication: STEMI  No Known Allergies  Patient Measurements: Height: 6' (182.9 cm) Weight: 285 lb (129.275 kg) IBW/kg (Calculated) : 77.6 Heparin Dosing Weight: 106.7 kg  Vital Signs: Temp: 98 F (36.7 C) (10/18 1600) Temp Source: Oral (10/18 1600) BP: 112/70 mmHg (10/18 1600) Pulse Rate: 55 (10/18 1600)  Labs:  Recent Labs  10/30/14 0527 10/30/14 0750 10/30/14 1252 10/30/14 1557  HGB 15.3  --   --   --   HCT 47.4  --   --   --   PLT 237  --   --   --   APTT  --   --  30  --   LABPROT  --   --  14.2  --   INR  --   --  1.08  --   HEPARINUNFRC  --   --   --  0.21*  CREATININE  --  1.61*  --   --   TROPONINI  --  0.10* 0.13* 0.14*    Estimated Creatinine Clearance: 75.5 mL/min (by C-G formula based on Cr of 1.61).   Medical History: Past Medical History  Diagnosis Date  . CHF (congestive heart failure) (HCC)     Medications:  Scheduled:  . amiodarone  400 mg Oral Daily  . aspirin EC  325 mg Oral Daily  . carvedilol  50 mg Oral BID WC  . cloNIDine  0.1 mg Oral BID  . digoxin  0.125 mg Oral Daily  . furosemide  40 mg Intravenous Q12H  . heparin subcutaneous  5,000 Units Subcutaneous 3 times per day  . irbesartan  300 mg Oral Daily  . nitroGLYCERIN  1 inch Topical 4 times per day  . potassium chloride  20 mEq Oral BID  . potassium chloride  40 mEq Oral BID  . rosuvastatin  5 mg Oral QODAY  . sodium chloride  3 mL Intravenous Q12H  . sodium chloride  3 mL Intravenous Q12H    Assessment: 49 y/o M admitted with STEMI to initiate heparin infusion. Patient not on anticoagulants PTA per med rec.   Goal of Therapy:  Heparin level 0.3-0.7 units/ml Monitor platelets by anticoagulation protocol: Yes   Plan:  HL =0.21  Will bolus 1600 units, then increase drip to 1700 units per hour. Recheck heparin level in 6 hours.   Gregory Chase D Gregory Chase 10/30/2014,6:47 PM

## 2014-10-30 NOTE — Progress Notes (Signed)
ANTICOAGULATION CONSULT NOTE - Initial Consult  Pharmacy Consult for Heparin Indication: STEMI  No Known Allergies  Patient Measurements: Height: 6' (182.9 cm) Weight: 285 lb (129.275 kg) IBW/kg (Calculated) : 77.6 Heparin Dosing Weight: 106.7 kg  Vital Signs: Temp: 98.9 F (37.2 C) (10/18 0900) Temp Source: Oral (10/18 0900) BP: 144/98 mmHg (10/18 0900) Pulse Rate: 78 (10/18 0900)  Labs:  Recent Labs  10/30/14 0527  HGB 15.3  HCT 47.4  PLT 237    CrCl cannot be calculated (Patient has no serum creatinine result on file.).   Medical History: Past Medical History  Diagnosis Date  . CHF (congestive heart failure) (HCC)     Medications:  Scheduled:  . heparin  4,000 Units Intravenous Once  . nitroGLYCERIN  1 inch Topical 4 times per day    Assessment: 49 y/o M admitted with STEMI to initiate heparin infusion. Patient not on anticoagulants PTA per med rec.   Goal of Therapy:  Heparin level 0.3-0.7 units/ml Monitor platelets by anticoagulation protocol: Yes   Plan:  Give 4000 units bolus x 1 Start heparin infusion at 1500 units/hr Check anti-Xa level in 6 hours and daily while on heparin Continue to monitor H&H and platelets  CMP is pending so will need to f/u to make sure that a 6 hour HL is appropriate.    Luisa Hart D 10/30/2014,9:17 AM

## 2014-10-30 NOTE — Progress Notes (Signed)
*  PRELIMINARY RESULTS* Echocardiogram 2D Echocardiogram has been performed.  Georgann Housekeeper Hege 10/30/2014, 3:20 PM

## 2014-10-30 NOTE — Progress Notes (Signed)
Dr Welton Flakes notified of pt's troponin results for 12noon at 0.13 and now 0.14 as elevated, MD states its ok d/t normal echo. No ss of distress noted.

## 2014-10-30 NOTE — H&P (Signed)
[facility] - [place] at [facility]   PATIENT NAME: Gregory Chase    MR#:  [id number]  DATE OF BIRTH:  Nov 25, 1965  DATE OF ADMISSION:  10/30/2014  PRIMARY CARE PHYSICIAN: Dr. Ellsworth Lennox   REQUESTING/REFERRING PHYSICIAN: Dr. Zenda Alpers  CHIEF COMPLAINT:   Chief Complaint  Patient presents with  . Shortness of Breath    HISTORY OF PRESENT ILLNESS: Gregory Chase  is a 49 y.o. male with a known history of obstructive sleep apnea, who is using CPAP, also history of CHF, obesity who presents to the hospital with complaints of shortness of breath. Upper lip. Patient was at home and he started having left lower quadrant abdominal pain which felt like bloating. He was also constipated that he started coughing, which got worse and he became very short of breath.  On arrival to emergency room, he was hypoxic, and severely hypertensive with systolic blood pressure of 220/150. He was initiated on BiPAP and given Lasix, nitroglycerin topically, diuresed and improved. Hospitalist services were contacted for admission. Patient's EKG revealed left bundle branch block, which seemed to be new. She denies any chest pains, troponin level 0.04 per emergency room physician, Dr. Zenda Alpers.   PAST MEDICAL HISTORY:   Past Medical History  Diagnosis Date  . CHF (congestive heart failure) (HCC)     PAST SURGICAL HISTORY: History reviewed. No pertinent past surgical history.  SOCIAL HISTORY:  Social History  Substance Use Topics  . Smoking status: Never Smoker   . Smokeless tobacco: Not on file  . Alcohol Use: No    FAMILY HISTORY: Hypertension, hyperlipidemia, diabetes mellitus, coronary artery disease. Patient's mother died at the age of 29 of MI. Patient's father died of diabetes complications at the age of 67.  DRUG ALLERGIES: No Known Allergies  Review of Systems  Unable to perform ROS: acuity of condition  Respiratory: Positive for cough and shortness of breath.    Gastrointestinal: Positive for nausea and abdominal pain.    MEDICATIONS AT HOME:  Prior to Admission medications   Medication Sig Start Date End Date Taking? Authorizing Provider  amiodarone (PACERONE) 400 MG tablet Take 400 mg by mouth daily.   Yes Historical Provider, MD  carvedilol (COREG) 25 MG tablet Take 50 mg by mouth 2 (two) times daily with a meal.   Yes Historical Provider, MD  cloNIDine (CATAPRES) 0.1 MG tablet Take 0.1 mg by mouth 2 (two) times daily.   Yes Historical Provider, MD  digoxin (LANOXIN) 0.125 MG tablet Take 0.125 mg by mouth daily.   Yes Historical Provider, MD  furosemide (LASIX) 40 MG tablet Take 40 mg by mouth 2 (two) times daily.   Yes Historical Provider, MD  olmesartan (BENICAR) 40 MG tablet Take 40 mg by mouth daily.   Yes Historical Provider, MD  rosuvastatin (CRESTOR) 5 MG tablet Take 5 mg by mouth every other day.   Yes Historical Provider, MD      PHYSICAL EXAMINATION:   VITAL SIGNS: Blood pressure 129/58, pulse 75, temperature 98.1 F (36.7 C), temperature source Oral, resp. rate 16, height 6' (1.829 m), weight 129.275 kg (285 lb), SpO2 100 %.  GENERAL:  49 y.o.-year-old patient lying in the bed in moderate to severe respiratory distress. Using BiPAP to breath. Uncomfortable, has difficulty speaking EYES: Pupils equal, round, reactive to light and accommodation. No scleral icterus. Extraocular muscles intact.  HEENT: Head atraumatic, normocephalic. Oropharynx and nasopharynx clear.  NECK:  Supple, no jugular venous distention. No thyroid enlargement, no tenderness.  LUNGS: Diminished breath sounds bilaterally, no wheezing, but bilateral anteriorly as well as posteriorly rales,rhonchi and crepitation. Using accessory muscles of respiration, has difficulty talking.  CARDIOVASCULAR: S1, S2 normal. No murmurs, rubs, or gallops. Tachycardic ABDOMEN: Soft, nontender, nondistended. Bowel sounds present. No organomegaly or mass.  EXTREMITIES: No pedal  edema, cyanosis, or clubbing.  NEUROLOGIC: Cranial nerves II through XII are intact. Muscle strength 5/5 in all extremities. Sensation intact. Gait not checked.  PSYCHIATRIC: The patient is somnolent and oriented x 3.  SKIN: No obvious rash, lesion, or ulcer.   LABORATORY PANEL:   CBC  Recent Labs Lab 10/30/14 0527  WBC 10.7*  HGB 15.3  HCT 47.4  PLT 237  MCV 81.8  MCH 26.4  MCHC 32.3  RDW 16.8*   ------------------------------------------------------------------------------------------------------------------  Chemistries  No results for input(s): NA, K, CL, CO2, GLUCOSE, BUN, CREATININE, CALCIUM, MG, AST, ALT, ALKPHOS, BILITOT in the last 168 hours.  Invalid input(s): GFRCGP ------------------------------------------------------------------------------------------------------------------  Cardiac Enzymes No results for input(s): TROPONINI in the last 168 hours. ------------------------------------------------------------------------------------------------------------------  RADIOLOGY: Dg Chest Portable 1 View  10/30/2014  CLINICAL DATA:  Acute onset of shortness of breath, with adventitious breath sounds. Initial encounter. EXAM: PORTABLE CHEST 1 VIEW COMPARISON:  Chest radiograph performed 10/01/2012 FINDINGS: The lungs are well-aerated. Vascular congestion is noted, with increased interstitial markings, concerning for pulmonary edema. There is no evidence of pleural effusion or pneumothorax. The cardiomediastinal silhouette is enlarged. No acute osseous abnormalities are seen. IMPRESSION: Vascular congestion and cardiomegaly, with increased interstitial markings, concerning for pulmonary edema. Electronically Signed   By: Roanna Raider M.D.   On: 10/30/2014 06:12    EKG: Orders placed or performed during the hospital encounter of 10/30/14  . EKG 12-Lead  . EKG 12-Lead    IMPRESSION AND PLAN:  Principal Problem:   Malignant essential hypertension Active  Problems:   Acute respiratory failure with hypoxia (HCC)   Abdominal pain, left lower quadrant   Constipation   Left bundle branch block 1. Malignant essential hypertension, admit patient to medical floor, continue him on home medications clonidine and Coreg as well as Benicar also add nitroglycerin topically 2. Acute respiratory failure with hypoxia, due to acute pulmonary edema , continue BiPAP as needed. Continue oxygen therapy, keeping pulse oximetry is around 94% and above 3. Acute CHF, acute pulmonary edema. Continue patient on Lasix as well as nitroglycerin topically. Continue Benicar. Follow clinically. Get echocardiogram and cardiology consultation 4. Left bundle branch block, questionable acute , cardiologist recommended heparin initiation, continue Coreg, aspirin, check cardiac enzymes 3, getting echocardiogram   All the records are reviewed and case discussed with ED provider. Management plans discussed with the patient, family and they are in agreement.  CODE STATUS: Full code   TOTAL CRITICAL CARE TIME TAKING CARE OF THIS PATIENT: 60 minutes.    Katharina Caper M.D on 10/30/2014 at 8:10 AM  Between 7am to 6pm - Pager - [phone] After 6pm go to [email or web address] - password [email or web address] [facility]  [facility]  Office  [phone]  CC: Primary care physician; No primary care provider on file.

## 2014-10-30 NOTE — ED Notes (Signed)
Pt voided in an urinal at the bed.

## 2014-10-31 DIAGNOSIS — N183 Chronic kidney disease, stage 3 unspecified: Secondary | ICD-10-CM

## 2014-10-31 DIAGNOSIS — I5023 Acute on chronic systolic (congestive) heart failure: Secondary | ICD-10-CM | POA: Insufficient documentation

## 2014-10-31 LAB — CBC
HCT: 42.4 % (ref 40.0–52.0)
Hemoglobin: 13.6 g/dL (ref 13.0–18.0)
MCH: 25.8 pg — ABNORMAL LOW (ref 26.0–34.0)
MCHC: 32 g/dL (ref 32.0–36.0)
MCV: 80.4 fL (ref 80.0–100.0)
PLATELETS: 188 10*3/uL (ref 150–440)
RBC: 5.27 MIL/uL (ref 4.40–5.90)
RDW: 16.6 % — AB (ref 11.5–14.5)
WBC: 6.5 10*3/uL (ref 3.8–10.6)

## 2014-10-31 LAB — BASIC METABOLIC PANEL
Anion gap: 6 (ref 5–15)
BUN: 19 mg/dL (ref 6–20)
CO2: 29 mmol/L (ref 22–32)
CREATININE: 1.61 mg/dL — AB (ref 0.61–1.24)
Calcium: 9.3 mg/dL (ref 8.9–10.3)
Chloride: 109 mmol/L (ref 101–111)
GFR calc Af Amer: 56 mL/min — ABNORMAL LOW (ref >60)
GFR, EST NON AFRICAN AMERICAN: 48 mL/min — AB (ref >60)
Glucose, Bld: 105 mg/dL — ABNORMAL HIGH (ref 65–99)
Potassium: 4 mmol/L (ref 3.5–5.1)
SODIUM: 144 mmol/L (ref 135–145)

## 2014-10-31 LAB — MAGNESIUM: Magnesium: 1.8 mg/dL (ref 1.7–2.4)

## 2014-10-31 LAB — DIGOXIN LEVEL: DIGOXIN LVL: 0.6 ng/mL — AB (ref 0.8–2.0)

## 2014-10-31 MED ORDER — POTASSIUM CHLORIDE CRYS ER 20 MEQ PO TBCR
20.0000 meq | EXTENDED_RELEASE_TABLET | Freq: Two times a day (BID) | ORAL | Status: DC
  Filled 2014-10-31: qty 1

## 2014-10-31 MED ORDER — SPIRONOLACTONE 25 MG PO TABS
25.0000 mg | ORAL_TABLET | Freq: Every day | ORAL | Status: DC
  Filled 2014-10-31: qty 1

## 2014-10-31 MED ORDER — SACUBITRIL-VALSARTAN 24-26 MG PO TABS
1.0000 | ORAL_TABLET | Freq: Two times a day (BID) | ORAL | Status: DC
  Filled 2014-10-31: qty 1

## 2014-10-31 MED ORDER — HEPARIN SODIUM (PORCINE) 5000 UNIT/ML IJ SOLN
5000.0000 [IU] | Freq: Three times a day (TID) | INTRAMUSCULAR | Status: DC
  Administered 2014-10-31 – 2014-11-01 (×5): 5000 [IU] via SUBCUTANEOUS
  Filled 2014-10-31 (×5): qty 1

## 2014-10-31 MED ORDER — SACUBITRIL-VALSARTAN 24-26 MG PO TABS: 1.0000 | ORAL_TABLET | Freq: Once | ORAL | Status: DC

## 2014-10-31 MED ORDER — SACUBITRIL-VALSARTAN 24-26 MG PO TABS: 1.0000 | ORAL_TABLET | Freq: Two times a day (BID) | ORAL | Status: AC

## 2014-10-31 NOTE — Care Management Note (Addendum)
Case Management Note  Patient Details  Name: FLAVIAN MCILWAIN MRN: [id number] Date of Birth: [birth year]  Subjective/Objective:    Received order from Myriam Forehand, [facility] for Life Vest and [facility] Precription. TC to Nani Gasser [phone]) with [employer] for order request. He is in route to [facility] to assist with Vest order. TC to [employer]. Called in [place] 24-26 mg 1 tab po 2 x daily, no refills. Medication requires prior authorization. TC to [facility] [phone]), received prior authorization # [id number] for 1 year. Cost for patient is 121.93. [place] provided RNCM with copay card.  Patient will get first month free and then $10 copay each month.              Action/Plan:   Expected Discharge Date:                  Expected Discharge Plan:  Home w Home Health Services  In-House Referral:     Discharge planning Services  CM Consult  Post Acute Care Choice:    Choice offered to:     DME Arranged:    DME Agency:     HH Arranged:    HH Agency:     Status of Service:  In process, will continue to follow  Medicare Important Message Given:    Date Medicare IM Given:    Medicare IM give by:    Date Additional Medicare IM Given:    Additional Medicare Important Message give by:     If discussed at Long Length of Stay Meetings, dates discussed:    Additional Comments:  Marily Memos, RN 10/31/2014, 9:49 AM

## 2014-10-31 NOTE — Care Management Note (Addendum)
Case Management Note  Patient Details  Name: Gregory Chase MRN: [id number] Date of Birth: 1965-04-12  Subjective/Objective:   Spoke with [name] @ [employer]. Insurance approval pending. Life Vest to be delivered as soon as Therapist, occupational received. He states it could be in a 1 hour or 2-3 hours.   Action/Plan:   Expected Discharge Date:                  Expected Discharge Plan:    In-House Referral:     Discharge planning Services  CM Consult  Post Acute Care Choice:    Choice offered to:     DME Arranged:    DME Agency:     HH Arranged:    HH Agency:     Status of Service:  In process, will continue to follow  Medicare Important Message Given:    Date Medicare IM Given:    Medicare IM give by:    Date Additional Medicare IM Given:    Additional Medicare Important Message give by:     If discussed at Long Length of Stay Meetings, dates discussed:    Additional Comments:  Marily Memos, RN 10/31/2014, 2:55 PM

## 2014-10-31 NOTE — Care Management (Signed)
Received patient from icu.  Awaiting insurance approval on [employer].  Patient to discharge home on [place] and ICU CM has investigated copay and prior auth and has provided patient with coupons.

## 2014-10-31 NOTE — Clinical Documentation Improvement (Signed)
Cardiology Internal Medicine  Abnormal Lab/Test Results:  Component      BUN Creatinine  Latest Ref Rng      6 - 20 mg/dL 0.61 - 1.24 mg/dL  10/30/2014     7:50 AM 19 1.61 (H)  10/31/2014      19 1.61 (H)   Component      EGFR (African American)  Latest Ref Rng      >60 mL/min  10/30/2014     7:50 AM 56 (L)  10/31/2014      56 (L)   Possible Clinical Conditions associated with below indicators  Acute kidney injury  Chronic kidney disease (please specify stage)  Acute kidney injury on chronic kidney disease (please specify stage)   Other Condition  Cannot Clinically Determie   Please exercise your independent, professional judgment when responding. A specific answer is not anticipated or expected.   Thank you, Mateo Flow, RN [phone] Clinical Documentation Specialist

## 2014-10-31 NOTE — Progress Notes (Addendum)
   SUBJECTIVE: Pt feeling much better, no longer requiring [place] o2. No CP or SOB.    Filed Vitals:   10/31/14 0500 10/31/14 0600 10/31/14 0700 10/31/14 0800  BP:  148/94  147/103  Pulse: 57 61 70 61  Temp:    99.6 F (37.6 C)  TempSrc:    Oral  Resp: 18 20 23 21   Height:      Weight: 124.5 kg (274 lb 7.6 oz)     SpO2: 91% 92% 90% 98%    Intake/Output Summary (Last 24 hours) at 10/31/14 0926 Last data filed at 10/31/14 0900  Gross per 24 hour  Intake    776 ml  Output   3075 ml  Net  -2299 ml    LABS: Basic Metabolic Panel:  Recent Labs  16/10/96 0750 10/31/14 0524  NA 141 144  K 3.1* 4.0  CL 107 109  CO2 27 29  GLUCOSE 86 105*  BUN 19 19  CREATININE 1.61* 1.61*  CALCIUM 8.6* 9.3  MG  --  1.8   Liver Function Tests:  Recent Labs  10/30/14 0750  AST 25  ALT 22  ALKPHOS 76  BILITOT 1.0  PROT 6.8  ALBUMIN 3.7   No results for input(s): LIPASE, AMYLASE in the last 72 hours. CBC:  Recent Labs  10/30/14 0527 10/31/14 0524  WBC 10.7* 6.5  HGB 15.3 13.6  HCT 47.4 42.4  MCV 81.8 80.4  PLT 237 188   Cardiac Enzymes:  Recent Labs  10/30/14 0750 10/30/14 1252 10/30/14 1557  TROPONINI 0.10* 0.13* 0.14*   BNP: Invalid input(s): POCBNP D-Dimer: No results for input(s): DDIMER in the last 72 hours. Hemoglobin A1C:  Recent Labs  10/30/14 1252  HGBA1C 5.7   Fasting Lipid Panel: No results for input(s): CHOL, HDL, LDLCALC, TRIG, CHOLHDL, LDLDIRECT in the last 72 hours. Thyroid Function Tests:  Recent Labs  10/30/14 1252  TSH 0.412   Anemia Panel: No results for input(s): VITAMINB12, FOLATE, FERRITIN, TIBC, IRON, RETICCTPCT in the last 72 hours.   PHYSICAL EXAM General: Well developed, well nourished, in no acute distress HEENT:  Normocephalic and atramatic Neck:  No JVD.  Lungs: Clear bilaterally to auscultation and percussion. Heart: HRRR . Normal S1 and S2 without gallops or murmurs.  Abdomen: Bowel sounds are positive, abdomen  soft and non-tender  Msk:  Back normal, normal gait. Normal strength and tone for age. Extremities: No clubbing, cyanosis or edema.   Neuro: Alert and oriented X 3. Psych:  Good affect, responds appropriately  TELEMETRY: Reviewed telemetry pt in NSR  ASSESSMENT AND PLAN: Acute on chronic systolic CHF and Dilated Cardiomyopathy: Advise changing Avapro to Entresto 24/26mg  BID, starting spironolactone 25mg  daily, and will get lifevest placed today.    Patient and plan discussed with supervising provider, Dr. Adrian Blackwater, who agrees with above findings.   Gregory Chase Margarito Courser [facility]  10/31/2014 9:26 AM

## 2014-10-31 NOTE — Discharge Summary (Addendum)
[facility] - [place] at [facility]   PATIENT NAME: Gregory Chase    MR#:  [id number]  DATE OF BIRTH:  12-31-1965  DATE OF ADMISSION:  10/30/2014 ADMITTING PHYSICIAN: Katharina Caper, MD  DATE OF DISCHARGE: No discharge date for patient encounter.  PRIMARY CARE PHYSICIAN: No primary care provider on file.     ADMISSION DIAGNOSIS:  Acute on chronic systolic congestive heart failure (HCC) [I50.23]  DISCHARGE DIAGNOSIS:  Principal Problem:   Malignant essential hypertension Active Problems:   Acute respiratory failure with hypoxia (HCC)   Abdominal pain, left lower quadrant   Constipation   Left bundle branch block   CKD (chronic kidney disease) stage 3, GFR 30-59 ml/min   Acute on chronic systolic CHF (congestive heart failure) (HCC)   SECONDARY DIAGNOSIS:   Past Medical History  Diagnosis Date  . CHF (congestive heart failure) (HCC)     .pro HOSPITAL COURSE:   Patient is 49 year old African-American male with history of obstructive sleep apnea CHF obesity who presents to the hospital with complaints of shortness of breath and abdominal pain. He was noted to be hypoxic in the emergency room and severely hypertensive with systolic blood pressure 220, he was initiated on Lasix nitroglycerin topically on BiPAP and diuresed and improved and admitted to intensive care unit. His cardiac enzymes were cycled and they were found to be slightly elevated to 0.1-0.14. His diuresis patient's oxygenation improved and he was weaned down to room air his blood pressure also improved and in fact it became low was initiation of his all home medications.  Cardiology consultation was obtained and medical management was suggested, including initiation of new medication Entresto. .  Discussion by problem 1. Malignant essential hypertension, discontinue clonidine and Coreg as well as Benicar , initiate Entresto, follow-up with cardiology for further recommendations 2.  Acute respiratory failure with hypoxia, due to acute pulmonary edema , continue Lasix  at the home doses  3. Acute on chronic systolic CHF, acute pulmonary edema. Continue patient on Lasix, Entresto, digoxin, follow-up with cardiologist in 2 days for further recommendations 4. Left bundle branch block, not acute , per cardiologist  5. Cardiomyopathy with ejection fraction of 20%, LifeVest will be placed upon discharge DISCHARGE CONDITIONS:   Good  CONSULTS OBTAINED:  Treatment Team:  Laurier Nancy, MD  DRUG ALLERGIES:  No Known Allergies  DISCHARGE MEDICATIONS:   Current Discharge Medication List    START taking these medications   Details  sacubitril-valsartan (ENTRESTO) 24-26 MG Take 1 tablet by mouth 2 (two) times daily. Qty: 60 tablet, Refills: 6      CONTINUE these medications which have NOT CHANGED   Details  digoxin (LANOXIN) 0.125 MG tablet Take 0.125 mg by mouth daily.    furosemide (LASIX) 40 MG tablet Take 40 mg by mouth 2 (two) times daily.    rosuvastatin (CRESTOR) 5 MG tablet Take 5 mg by mouth every other day.      STOP taking these medications     amiodarone (PACERONE) 400 MG tablet      carvedilol (COREG) 25 MG tablet      cloNIDine (CATAPRES) 0.1 MG tablet      olmesartan (BENICAR) 40 MG tablet          DISCHARGE INSTRUCTIONS:    Patient is to follow-up with Dr. Welton Flakes in 2 days after discharge  If you experience worsening of your admission symptoms, develop shortness of breath, life threatening emergency, suicidal or homicidal thoughts you must seek  medical attention immediately by calling 911 or calling your MD immediately  if symptoms less severe.  You Must read complete instructions/literature along with all the possible adverse reactions/side effects for all the Medicines you take and that have been prescribed to you. Take any new Medicines after you have completely understood and accept all the possible adverse reactions/side effects.    Please note  You were cared for by a hospitalist during your hospital stay. If you have any questions about your discharge medications or the care you received while you were in the hospital after you are discharged, you can call the unit and asked to speak with the hospitalist on call if the hospitalist that took care of you is not available. Once you are discharged, your primary care physician will handle any further medical issues. Please note that NO REFILLS for any discharge medications will be authorized once you are discharged, as it is imperative that you return to your primary care physician (or establish a relationship with a primary care physician if you do not have one) for your aftercare needs so that they can reassess your need for medications and monitor your lab values.    Today   CHIEF COMPLAINT:   Chief Complaint  Patient presents with  . Shortness of Breath    HISTORY OF PRESENT ILLNESS:  Gregory Chase  is a 49 y.o. male with a known history of obstructive sleep apnea CHF obesity who presents to the hospital with complaints of shortness of breath and abdominal pain. He was noted to be hypoxic in the emergency room and severely hypertensive with systolic blood pressure 220, he was initiated on Lasix nitroglycerin topically on BiPAP and diuresed and improved and admitted to intensive care unit. His cardiac enzymes were cycled and they were found to be slightly elevated to 0.1-0.14. His diuresis patient's oxygenation improved and he was weaned down to room air his blood pressure also improved and in fact it became low was initiation of his all home medications.  Cardiology consultation was obtained and medical management was suggested, including initiation of new medication Entresto. .  Discussion by problem 1. Malignant essential hypertension, discontinue clonidine and Coreg as well as Benicar , initiate Entresto, follow-up with cardiology for further recommendations 2.  Acute respiratory failure with hypoxia, due to acute pulmonary edema , continue Lasix  at the home doses  3. Acute on chronic systolic CHF, acute pulmonary edema. Continue patient on Lasix, Entresto, digoxin, follow-up with cardiologist in 2 days for further recommendations 4. Left bundle branch block, not acute , per cardiologist  5. Cardiomyopathy with ejection fraction of 20%, LifeVest will be placed upon discharge   VITAL SIGNS:  Blood pressure 113/89, pulse 49, temperature 98.2 F (36.8 C), temperature source Oral, resp. rate 15, height 6' (1.829 m), weight 124.5 kg (274 lb 7.6 oz), SpO2 99 %.  I/O:   Intake/Output Summary (Last 24 hours) at 10/31/14 1531 Last data filed at 10/31/14 1200  Gross per 24 hour  Intake    962 ml  Output   1775 ml  Net   -813 ml    PHYSICAL EXAMINATION:  GENERAL:  49 y.o.-year-old patient lying in the bed with no acute distress.  EYES: Pupils equal, round, reactive to light and accommodation. No scleral icterus. Extraocular muscles intact.  HEENT: Head atraumatic, normocephalic. Oropharynx and nasopharynx clear.  NECK:  Supple, no jugular venous distention. No thyroid enlargement, no tenderness.  LUNGS: Normal breath sounds bilaterally, no wheezing, rales,rhonchi or  crepitation. No use of accessory muscles of respiration.  CARDIOVASCULAR: S1, S2 normal. No murmurs, rubs, or gallops.  ABDOMEN: Soft, non-tender, non-distended. Bowel sounds present. No organomegaly or mass.  EXTREMITIES: No pedal edema, cyanosis, or clubbing.  NEUROLOGIC: Cranial nerves II through XII are intact. Muscle strength 5/5 in all extremities. Sensation intact. Gait not checked.  PSYCHIATRIC: The patient is alert and oriented x 3.  SKIN: No obvious rash, lesion, or ulcer.   DATA REVIEW:   CBC  Recent Labs Lab 10/31/14 0524  WBC 6.5  HGB 13.6  HCT 42.4  PLT 188    Chemistries   Recent Labs Lab 10/30/14 0750 10/31/14 0524  NA 141 144  K 3.1* 4.0  CL 107 109   CO2 27 29  GLUCOSE 86 105*  BUN 19 19  CREATININE 1.61* 1.61*  CALCIUM 8.6* 9.3  MG  --  1.8  AST 25  --   ALT 22  --   ALKPHOS 76  --   BILITOT 1.0  --     Cardiac Enzymes  Recent Labs Lab 10/30/14 1557  TROPONINI 0.14*    Microbiology Results  Results for orders placed or performed during the hospital encounter of 10/30/14  MRSA PCR Screening     Status: None   Collection Time: 10/30/14  9:00 AM  Result Value Ref Range Status   MRSA by PCR NEGATIVE NEGATIVE Final    Comment:        The GeneXpert MRSA Assay (FDA approved for NASAL specimens only), is one component of a comprehensive MRSA colonization surveillance program. It is not intended to diagnose MRSA infection nor to guide or monitor treatment for MRSA infections.     RADIOLOGY:  Dg Chest Portable 1 View  10/30/2014  CLINICAL DATA:  Acute onset of shortness of breath, with adventitious breath sounds. Initial encounter. EXAM: PORTABLE CHEST 1 VIEW COMPARISON:  Chest radiograph performed 10/01/2012 FINDINGS: The lungs are well-aerated. Vascular congestion is noted, with increased interstitial markings, concerning for pulmonary edema. There is no evidence of pleural effusion or pneumothorax. The cardiomediastinal silhouette is enlarged. No acute osseous abnormalities are seen. IMPRESSION: Vascular congestion and cardiomegaly, with increased interstitial markings, concerning for pulmonary edema. Electronically Signed   By: Roanna Raider M.D.   On: 10/30/2014 06:12    EKG:   Orders placed or performed during the hospital encounter of 10/30/14  . EKG 12-Lead  . EKG 12-Lead      Management plans discussed with the patient, family and they are in agreement.  CODE STATUS:     Code Status Orders        Start     Ordered   10/30/14 1020  Full code   Continuous     10/30/14 1019      TOTAL TIME TAKING CARE OF THIS PATIENT: 60 minutes.   PS:  Patient is clinically stable to be discharged home  today.  Patient's wife is extremely unhappy with patient having two patches of  nitroglycerin accidentally placed on his chest, ater taking nitroglycerin patches patient's blood pressure changed from 110s to 140s. Patient's wife is reluctant to take him home and in fact she refuses to return back home because she feels that patient has blood pressure is fluctuating too much. She is also unsure if initiation of new medication Sherryll Burger is appropriate at home, despite prolonged discussion about contrary. Coordination of time 20 minutes  Ryn Peine M.D on 10/31/2014 at 3:31 PM  Between 7am to 6pm - Pager -  [phone]  After 6pm go to [email or web address] - password [email or web address] [facility]  [facility]  Office  [phone]  CC: Primary care physician; No primary care provider on file.

## 2014-10-31 NOTE — Progress Notes (Signed)
Patient BP 141/101 MD made aware no new orders  Given at this time,willl continue to monitor

## 2014-10-31 NOTE — Care Management Note (Signed)
Case Management Note  Patient Details  Name: Gregory Chase MRN: [id number] Date of Birth: 1965-08-19  Subjective/Objective:  Gregory Chase with [employer] came by to get needed information for [employer]. He is in route to cardiology office have Eilene sign order.  [employer] representative should be here this afternoon to fit vest and educate patient and spouse. Updated primary nurse, Gregory Chase. Patietn works daily so he will have no home health needs.                   Action/Plan Home with Self Care, [employer] Life Vest and Lynne Logan  Expected Discharge Date:   10/31/2014               Expected Discharge Plan:  Home/Self Care  In-House Referral:     Discharge planning Services  CM Consult  Post Acute Care Choice:    Choice offered to:     DME Arranged:    DME Agency:     HH Arranged:    HH Agency:     Status of Service:  In process, will continue to follow  Medicare Important Message Given:    Date Medicare IM Given:    Medicare IM give by:    Date Additional Medicare IM Given:    Additional Medicare Important Message give by:     If discussed at Long Length of Stay Meetings, dates discussed:    Additional Comments:  Marily Memos, RN 10/31/2014, 10:43 AM

## 2014-11-01 MED ORDER — AMIODARONE HCL 200 MG PO TABS
200.0000 mg | ORAL_TABLET | Freq: Every day | ORAL | Status: DC
  Administered 2014-11-01: 200 mg via ORAL
  Filled 2014-11-01: qty 1

## 2014-11-01 MED ORDER — FUROSEMIDE 40 MG PO TABS
40.0000 mg | ORAL_TABLET | Freq: Every day | ORAL | Status: DC
Start: 2014-11-01 — End: 2014-11-01
  Administered 2014-11-01: 40 mg via ORAL
  Filled 2014-11-01: qty 1

## 2014-11-01 MED ORDER — AMIODARONE HCL 200 MG PO TABS: 400.0000 mg | ORAL_TABLET | Freq: Every day | ORAL | Status: DC

## 2014-11-01 MED ORDER — SACUBITRIL-VALSARTAN 24-26 MG PO TABS
1.0000 | ORAL_TABLET | Freq: Two times a day (BID) | ORAL | Status: DC
  Administered 2014-11-01: 1 via ORAL
  Filled 2014-11-01 (×2): qty 1

## 2014-11-01 MED ORDER — FUROSEMIDE 40 MG PO TABS: 40.0000 mg | ORAL_TABLET | Freq: Every day | ORAL | Status: AC

## 2014-11-01 MED ORDER — AMIODARONE HCL 200 MG PO TABS: 200.0000 mg | ORAL_TABLET | Freq: Every day | ORAL | Status: AC

## 2014-11-01 MED ORDER — CARVEDILOL 6.25 MG PO TABS: 6.2500 mg | ORAL_TABLET | Freq: Two times a day (BID) | ORAL | Status: AC

## 2014-11-01 MED ORDER — CARVEDILOL 6.25 MG PO TABS
6.2500 mg | ORAL_TABLET | Freq: Two times a day (BID) | ORAL | Status: DC
  Administered 2014-11-01: 6.25 mg via ORAL
  Filled 2014-11-01: qty 1

## 2014-11-01 NOTE — Progress Notes (Signed)
SUBJECTIVE: Pt c/o HA this morning. No CP or SOB.    Filed Vitals:   10/31/14 2059 10/31/14 2105 11/01/14 0354 11/01/14 0356  BP:  142/102 180/72 141/88  Pulse:  63 63 66  Temp:   98.4 F (36.9 C)   TempSrc:   Oral   Resp:   18   Height:      Weight:   95.301 kg (210 lb 1.6 oz)   SpO2: 95%  89%     Intake/Output Summary (Last 24 hours) at 11/01/14 0905 Last data filed at 11/01/14 0800  Gross per 24 hour  Intake      0 ml  Output      0 ml  Net      0 ml    LABS: Basic Metabolic Panel:  Recent Labs  25/00/37 1901 10/31/14 0351  NA 136 138  K 3.7 4.0  CL 105 107  CO2 23 26  GLUCOSE 103* 96  BUN 14 14  CREATININE 1.32* 1.22  CALCIUM 8.9 8.6*   Liver Function Tests: No results for input(s): AST, ALT, ALKPHOS, BILITOT, PROT, ALBUMIN in the last 72 hours. No results for input(s): LIPASE, AMYLASE in the last 72 hours. CBC:  Recent Labs  10/30/14 1901 10/31/14 0351  WBC 9.3 6.7  HGB 15.6 15.2  HCT 45.1 43.8  MCV 91.2 91.5  PLT 150 128*   Cardiac Enzymes:  Recent Labs  10/30/14 2358 10/31/14 0351 10/31/14 1044  TROPONINI <0.03 <0.03 <0.03   BNP: Invalid input(s): POCBNP D-Dimer: No results for input(s): DDIMER in the last 72 hours. Hemoglobin A1C: No results for input(s): HGBA1C in the last 72 hours. Fasting Lipid Panel: No results for input(s): CHOL, HDL, LDLCALC, TRIG, CHOLHDL, LDLDIRECT in the last 72 hours. Thyroid Function Tests:  Recent Labs  10/31/14 1547  TSH 2.005   Anemia Panel: No results for input(s): VITAMINB12, FOLATE, FERRITIN, TIBC, IRON, RETICCTPCT in the last 72 hours.   PHYSICAL EXAM General: Well developed, well nourished, in no acute distress HEENT:  Normocephalic and atramatic Neck:  No JVD.  Lungs: Clear bilaterally to auscultation and percussion. Heart: HRRR . Normal S1 and S2 without gallops or murmurs.  Abdomen: Bowel sounds are positive, abdomen soft and non-tender  Msk:  Back normal, normal gait. Normal  strength and tone for age. Extremities: No clubbing, cyanosis or edema.   Neuro: Alert and oriented X 3. Psych:  Good affect, responds appropriately  TELEMETRY: Reviewed telemetry pt in NSR  ASSESSMENT AND PLAN: acute on chronic dilated cardiomyopathy: ok from cards stand point for d/c with w/u in office tomorrow at 10am. Advise continuation of carvedilol, entresto, amiodarone, lasix and spironolactone and also discussed compliance with lifevest.    Patient and plan discussed with supervising provider, Dr. Adrian Blackwater, who agrees with above findings.   Gregory Chase Margarito Courser [facility]  11/01/2014 9:05 AM

## 2014-11-01 NOTE — Progress Notes (Signed)
Dr. Winona Legato made aware of pt's BP of 141/102.  No new orders given.

## 2014-11-01 NOTE — Discharge Summary (Signed)
[facility] - [place] at [facility]   PATIENT NAME: Gregory Chase    MR#:  [id number]  DATE OF BIRTH:  21-Nov-1965  DATE OF ADMISSION:  10/30/2014 ADMITTING PHYSICIAN: Katharina Caper, MD  DATE OF DISCHARGE: 20th of October 2016.  PRIMARY CARE PHYSICIAN: No primary care provider on file.     ADMISSION DIAGNOSIS:  Acute on chronic systolic congestive heart failure (HCC) [I50.23]  DISCHARGE DIAGNOSIS:  Principal Problem:   Malignant essential hypertension Active Problems:   Acute respiratory failure with hypoxia (HCC)   Abdominal pain, left lower quadrant   Constipation   Left bundle branch block   CKD (chronic kidney disease) stage 3, GFR 30-59 ml/min   Acute on chronic systolic CHF (congestive heart failure) (HCC)   SECONDARY DIAGNOSIS:   Past Medical History  Diagnosis Date  . CHF (congestive heart failure) (HCC)     .pro HOSPITAL COURSE:   Patient is 49 year old African-American male with history of obstructive sleep apnea CHF obesity who presents to the hospital with complaints of shortness of breath and abdominal pain. He was noted to be hypoxic in the emergency room and severely hypertensive with systolic blood pressure 220, he was initiated on Lasix nitroglycerin topically on BiPAP and diuresed and improved and admitted to intensive care unit. His cardiac enzymes were cycled and they were found to be slightly elevated to 0.1-0.14. After diuresis patient's oxygenation improved and he was weaned down to room air,  his blood pressure also improved and in fact it became low after initiation of his all home medications, signifying that he unlikely took his medications at home.  Cardiology consultation was obtained and medical management was suggested, including initiation of new medication Entresto. .  Discussion by problem 1. Malignant essential hypertension, discontinue clonidine and Benicar and continue lower dose of Coreg, initiate Entresto,  follow-up with cardiology for further recommendations in the next 1-2 days after discharge as outpatient 2. Acute respiratory failure with hypoxia, due to acute pulmonary edema , continue Lasix  at lower than home doses due to concerns of hypotension, advance the higher depending when his blood pressure readings and his kidney function.  3. Acute on chronic systolic CHF, acute pulmonary edema. Continue patient on Lasix, Entresto, digoxin, follow-up with cardiologist in 1-2 days for further recommendations 4. Left bundle branch block, not acute , per cardiologist  5. Cardiomyopathy with ejection fraction of 20%, LifeVest will be placed upon discharge, spironolactone will be ordered as outpatient, discussed with cardiology 6. Abdominal pain, likely due to constipation, resolved  DISCHARGE CONDITIONS:   Good  CONSULTS OBTAINED:  Treatment Team:  Laurier Nancy, MD  DRUG ALLERGIES:  No Known Allergies  DISCHARGE MEDICATIONS:   Current Discharge Medication List    START taking these medications   Details  sacubitril-valsartan (ENTRESTO) 24-26 MG Take 1 tablet by mouth 2 (two) times daily. Qty: 60 tablet, Refills: 6      CONTINUE these medications which have CHANGED   Details  amiodarone (PACERONE) 200 MG tablet Take 1 tablet (200 mg total) by mouth daily. Qty: 30 tablet, Refills: 5    carvedilol (COREG) 6.25 MG tablet Take 1 tablet (6.25 mg total) by mouth 2 (two) times daily with a meal. Qty: 60 tablet, Refills: 6    furosemide (LASIX) 40 MG tablet Take 1 tablet (40 mg total) by mouth daily. Qty: 30 tablet, Refills: 6      CONTINUE these medications which have NOT CHANGED   Details  digoxin (LANOXIN)  0.125 MG tablet Take 0.125 mg by mouth daily.    rosuvastatin (CRESTOR) 5 MG tablet Take 5 mg by mouth every other day.      STOP taking these medications     cloNIDine (CATAPRES) 0.1 MG tablet      olmesartan (BENICAR) 40 MG tablet          DISCHARGE INSTRUCTIONS:     Patient is to follow-up with Dr. Welton Flakes in 2 days after discharge  If you experience worsening of your admission symptoms, develop shortness of breath, life threatening emergency, suicidal or homicidal thoughts you must seek medical attention immediately by calling 911 or calling your MD immediately  if symptoms less severe.  You Must read complete instructions/literature along with all the possible adverse reactions/side effects for all the Medicines you take and that have been prescribed to you. Take any new Medicines after you have completely understood and accept all the possible adverse reactions/side effects.   Please note  You were cared for by a hospitalist during your hospital stay. If you have any questions about your discharge medications or the care you received while you were in the hospital after you are discharged, you can call the unit and asked to speak with the hospitalist on call if the hospitalist that took care of you is not available. Once you are discharged, your primary care physician will handle any further medical issues. Please note that NO REFILLS for any discharge medications will be authorized once you are discharged, as it is imperative that you return to your primary care physician (or establish a relationship with a primary care physician if you do not have one) for your aftercare needs so that they can reassess your need for medications and monitor your lab values.    Today   CHIEF COMPLAINT:   Chief Complaint  Patient presents with  . Shortness of Breath    HISTORY OF PRESENT ILLNESS:  Gregory Chase  is a 49 y.o. male with a known history of obstructive sleep apnea CHF obesity who presents to the hospital with complaints of shortness of breath and abdominal pain.He was noted to be hypoxic in the emergency room and severely hypertensive with systolic blood pressure 220, he was initiated on Lasix nitroglycerin topically on BiPAP and diuresed and  improved and admitted to intensive care unit. His cardiac enzymes were cycled and they were found to be slightly elevated to 0.1-0.14. After diuresis patient's oxygenation improved and he was weaned down to room air,  his blood pressure also improved and in fact it became low after initiation of his all home medications, signifying that he unlikely took his medications at home.  Cardiology consultation was obtained and medical management was suggested, including initiation of new medication Entresto. .  Discussion by problem 1. Malignant essential hypertension, discontinue clonidine and Benicar and continue lower dose of Coreg, initiate Entresto, follow-up with cardiology for further recommendations in the next 1-2 days after discharge as outpatient 2. Acute respiratory failure with hypoxia, due to acute pulmonary edema , continue Lasix  at lower than home doses due to concerns of hypotension, advance the higher depending when his blood pressure readings and his kidney function.  3. Acute on chronic systolic CHF, acute pulmonary edema. Continue patient on Lasix, Entresto, digoxin, follow-up with cardiologist in 1-2 days for further recommendations 4. Left bundle branch block, not acute , per cardiologist  5. Cardiomyopathy with ejection fraction of 20%, LifeVest will be placed upon discharge, spironolactone will be ordered  as outpatient, discussed with cardiology 6. Abdominal pain, likely due to constipation, resolved    VITAL SIGNS:  Blood pressure 141/102, pulse 62, temperature 97.8 F (36.6 C), temperature source Oral, resp. rate 17, height 6' (1.829 m), weight 127.642 kg (281 lb 6.4 oz), SpO2 92 %.  I/O:    Intake/Output Summary (Last 24 hours) at 11/01/14 1323 Last data filed at 11/01/14 1200  Gross per 24 hour  Intake    123 ml  Output   1450 ml  Net  -1327 ml    PHYSICAL EXAMINATION:  GENERAL:  49 y.o.-year-old patient lying in the bed with no acute distress.  EYES: Pupils equal,  round, reactive to light and accommodation. No scleral icterus. Extraocular muscles intact.  HEENT: Head atraumatic, normocephalic. Oropharynx and nasopharynx clear.  NECK:  Supple, no jugular venous distention. No thyroid enlargement, no tenderness.  LUNGS: Normal breath sounds bilaterally, no wheezing, rales,rhonchi or crepitation. No use of accessory muscles of respiration.  CARDIOVASCULAR: S1, S2 normal. No murmurs, rubs, or gallops.  ABDOMEN: Soft, non-tender, non-distended. Bowel sounds present. No organomegaly or mass.  EXTREMITIES: No pedal edema, cyanosis, or clubbing.  NEUROLOGIC: Cranial nerves II through XII are intact. Muscle strength 5/5 in all extremities. Sensation intact. Gait not checked.  PSYCHIATRIC: The patient is alert and oriented x 3.  SKIN: No obvious rash, lesion, or ulcer.   DATA REVIEW:   CBC  Recent Labs Lab 10/31/14 0524  WBC 6.5  HGB 13.6  HCT 42.4  PLT 188    Chemistries   Recent Labs Lab 10/30/14 0750 10/31/14 0524  NA 141 144  K 3.1* 4.0  CL 107 109  CO2 27 29  GLUCOSE 86 105*  BUN 19 19  CREATININE 1.61* 1.61*  CALCIUM 8.6* 9.3  MG  --  1.8  AST 25  --   ALT 22  --   ALKPHOS 76  --   BILITOT 1.0  --     Cardiac Enzymes  Recent Labs Lab 10/30/14 1557  TROPONINI 0.14*    Microbiology Results  Results for orders placed or performed during the hospital encounter of 10/30/14  MRSA PCR Screening     Status: None   Collection Time: 10/30/14  9:00 AM  Result Value Ref Range Status   MRSA by PCR NEGATIVE NEGATIVE Final    Comment:        The GeneXpert MRSA Assay (FDA approved for NASAL specimens only), is one component of a comprehensive MRSA colonization surveillance program. It is not intended to diagnose MRSA infection nor to guide or monitor treatment for MRSA infections.     RADIOLOGY:  No results found.  EKG:   Orders placed or performed during the hospital encounter of 10/30/14  . EKG 12-Lead  . EKG  12-Lead      Management plans discussed with the patient, family and they are in agreement.  CODE STATUS:     Code Status Orders        Start     Ordered   10/30/14 1020  Full code   Continuous     10/30/14 1019      TOTAL TIME TAKING CARE OF THIS PATIENT: 35 minutes.  Discussed with cardiology   Layth Cerezo M.D on 11/01/2014 at 1:23 PM  Between 7am to 6pm - Pager - [phone]  After 6pm go to [email or web address] - password [email or web address]  [facility]  Office  [phone]  CC: Primary care physician; No primary care provider on file.

## 2014-11-01 NOTE — Care Management (Signed)
No further discharge needs.

## 2014-11-01 NOTE — Progress Notes (Signed)
Pt discharged to home via wc.  Instructions and rx given to pt.  Questions answered.  No distress.  

## 2015-01-28 ENCOUNTER — Other Ambulatory Visit: Payer: Self-pay | Admitting: Physician Assistant

## 2015-01-30 ENCOUNTER — Other Ambulatory Visit: Payer: Self-pay | Admitting: Emergency Medicine

## 2015-01-30 NOTE — Telephone Encounter (Signed)
Received a faxed medication request from [place] on [address]..  Please advise.  Thank you.

## 2015-02-05 ENCOUNTER — Other Ambulatory Visit: Payer: Self-pay | Admitting: Emergency Medicine

## 2015-02-05 NOTE — Telephone Encounter (Signed)
Patient called and requested a refill for his Allupurinol to be called in to [place] on [address].

## 2015-02-06 ENCOUNTER — Ambulatory Visit: Payer: Self-pay | Admitting: Physician Assistant

## 2015-02-06 ENCOUNTER — Encounter: Payer: Self-pay | Admitting: Physician Assistant

## 2015-02-06 VITALS — BP 140/80 | HR 68 | Temp 98.2°F

## 2015-02-06 DIAGNOSIS — M1000 Idiopathic gout, unspecified site: Secondary | ICD-10-CM

## 2015-02-06 MED ORDER — ALLOPURINOL 300 MG PO TABS: 300.0000 mg | ORAL_TABLET | Freq: Every day | ORAL | Status: AC

## 2015-02-06 NOTE — Progress Notes (Signed)
S: here for med refill for allopurinol, states hasn't had any problems with gout since using the medication, sees reg dr at [facility], dr Welton Flakes is his cardiologist, just had labs with them last month, denies cp/sob/pain in joints  O: vitals wnl, nad, lungs c t a, cv rrr, no joint tenderness, no edema noted  A: hx gout  P: refill allopurinol, f/u with pcp as needed

## 2015-02-14 ENCOUNTER — Emergency Department: Payer: Managed Care, Other (non HMO)

## 2015-02-14 ENCOUNTER — Emergency Department
Admission: EM | Admit: 2015-02-14 | Discharge: 2015-02-14 | Disposition: A | Discharge status: Other Acute Inpatient Hospital | Payer: Managed Care, Other (non HMO) | Attending: Student | Admitting: Student

## 2015-02-14 DIAGNOSIS — I469 Cardiac arrest, cause unspecified: Secondary | ICD-10-CM

## 2015-02-14 DIAGNOSIS — I129 Hypertensive chronic kidney disease with stage 1 through stage 4 chronic kidney disease, or unspecified chronic kidney disease: Secondary | ICD-10-CM | POA: Insufficient documentation

## 2015-02-14 DIAGNOSIS — N183 Chronic kidney disease, stage 3 (moderate): Secondary | ICD-10-CM | POA: Insufficient documentation

## 2015-02-14 LAB — COMPREHENSIVE METABOLIC PANEL
ALBUMIN: 3.6 g/dL (ref 3.5–5.0)
ALT: 104 U/L — ABNORMAL HIGH (ref 17–63)
AST: 99 U/L — AB (ref 15–41)
Alkaline Phosphatase: 107 U/L (ref 38–126)
Anion gap: 11 (ref 5–15)
BILIRUBIN TOTAL: 0.9 mg/dL (ref 0.3–1.2)
BUN: 23 mg/dL — AB (ref 6–20)
CHLORIDE: 104 mmol/L (ref 101–111)
CO2: 23 mmol/L (ref 22–32)
Calcium: 8.9 mg/dL (ref 8.9–10.3)
Creatinine, Ser: 2.11 mg/dL — ABNORMAL HIGH (ref 0.61–1.24)
GFR calc Af Amer: 40 mL/min — ABNORMAL LOW (ref >60)
GFR calc non Af Amer: 35 mL/min — ABNORMAL LOW (ref >60)
GLUCOSE: 201 mg/dL — AB (ref 65–99)
POTASSIUM: 3.4 mmol/L — AB (ref 3.5–5.1)
SODIUM: 138 mmol/L (ref 135–145)
TOTAL PROTEIN: 6.8 g/dL (ref 6.5–8.1)

## 2015-02-14 LAB — BLOOD GAS, ARTERIAL
ALLENS TEST (PASS/FAIL): POSITIVE — AB
Acid-base deficit: 6.4 mmol/L — ABNORMAL HIGH (ref 0.0–2.0)
Bicarbonate: 21 mEq/L (ref 21.0–28.0)
FIO2: 0.8
MECHANICAL RATE: 22
O2 Saturation: 99.7 %
PEEP: 5 cmH2O
Patient temperature: 37
VT: 500 mL
pCO2 arterial: 48 mmHg (ref 32.0–48.0)
pH, Arterial: 7.25 — ABNORMAL LOW (ref 7.350–7.450)
pO2, Arterial: 231 mmHg — ABNORMAL HIGH (ref 83.0–108.0)

## 2015-02-14 LAB — PROTIME-INR
INR: 1.18
Prothrombin Time: 15.2 seconds — ABNORMAL HIGH (ref 11.4–15.0)

## 2015-02-14 LAB — T4, FREE: FREE T4: 0.93 ng/dL (ref 0.61–1.12)

## 2015-02-14 LAB — ETHANOL

## 2015-02-14 LAB — BRAIN NATRIURETIC PEPTIDE: B NATRIURETIC PEPTIDE 5: 323 pg/mL — AB (ref 0.0–100.0)

## 2015-02-14 LAB — TSH: TSH: 6.417 u[IU]/mL — AB (ref 0.350–4.500)

## 2015-02-14 LAB — TROPONIN I: Troponin I: 0.07 ng/mL — ABNORMAL HIGH (ref <0.031)

## 2015-02-14 LAB — APTT: APTT: 32 s (ref 24–36)

## 2015-02-14 LAB — TRIGLYCERIDES: TRIGLYCERIDES: 139 mg/dL (ref <150)

## 2015-02-14 MED ORDER — ETOMIDATE 2 MG/ML IV SOLN
INTRAVENOUS | Status: AC | PRN
  Administered 2015-02-14: 30 mg via INTRAVENOUS

## 2015-02-14 MED ORDER — PROPOFOL 1000 MG/100ML IV EMUL: 0.0000 ug/kg/min | INTRAVENOUS | Status: DC

## 2015-02-14 MED ORDER — SODIUM CHLORIDE 0.9 % IV SOLN
INTRAVENOUS | Status: AC | PRN
  Administered 2015-02-14: 999 mL via INTRAVENOUS

## 2015-02-14 MED ORDER — PROPOFOL 1000 MG/100ML IV EMUL: 5.0000 ug/kg/min | INTRAVENOUS | Status: DC

## 2015-02-14 MED ORDER — PROPOFOL 1000 MG/100ML IV EMUL
INTRAVENOUS | Status: AC | PRN
  Administered 2015-02-14: 4.965 ug/kg/min via INTRAVENOUS

## 2015-02-14 MED ORDER — DOPAMINE-DEXTROSE 1.6-5 MG/ML-% IV SOLN: 2.0000 ug/kg/min | Freq: Once | INTRAVENOUS | Status: DC

## 2015-02-14 MED ORDER — DOPAMINE-DEXTROSE 3.2-5 MG/ML-% IV SOLN
INTRAVENOUS | Status: AC | PRN
  Administered 2015-02-14: 10 ug/kg/min via INTRAVENOUS

## 2015-02-14 MED ORDER — SODIUM CHLORIDE 0.9 % IV SOLN
INTRAVENOUS | Status: AC | PRN
  Administered 2015-02-14: 1000 mL via INTRAVENOUS

## 2015-02-14 MED ORDER — PROPOFOL 1000 MG/100ML IV EMUL
INTRAVENOUS | Status: AC
  Filled 2015-02-14: qty 100

## 2015-02-14 MED ORDER — SUCCINYLCHOLINE CHLORIDE 20 MG/ML IJ SOLN
INTRAMUSCULAR | Status: AC | PRN
Start: 2015-02-14 — End: 2015-02-14
  Administered 2015-02-14: 120 mg via INTRAVENOUS

## 2015-02-14 MED ORDER — PROPOFOL 1000 MG/100ML IV EMUL
0.0000 ug/kg/min | INTRAVENOUS | Status: DC
  Administered 2015-02-14: 15 ug/kg/min via INTRAVENOUS

## 2015-02-14 MED ORDER — MIDAZOLAM HCL 2 MG/2ML IJ SOLN
INTRAMUSCULAR | Status: AC
Start: 2015-02-14 — End: 2015-02-14
  Administered 2015-02-14: 2 mg
  Filled 2015-02-14: qty 2

## 2015-02-14 MED ORDER — DOPAMINE-DEXTROSE 3.2-5 MG/ML-% IV SOLN
0.0000 ug/kg/min | INTRAVENOUS | Status: DC
Start: 2015-02-14 — End: 2015-02-15

## 2015-02-14 MED ORDER — FENTANYL CITRATE (PF) 100 MCG/2ML IJ SOLN
INTRAMUSCULAR | Status: AC
  Administered 2015-02-14: 100 ug
  Filled 2015-02-14: qty 2

## 2015-02-14 NOTE — ED Notes (Addendum)
Patient comes into ED via EMS as emergency traffic with CPR in progress.  Patient had a witnessed seizure at home where he became unresponsive.  Firefighters on scene started CPR and were able to get pulses back.  CPR in progress total of 30 minutes. 4 of epi given in the field and 1 shock delivered.

## 2015-02-14 NOTE — Code Documentation (Signed)
Patient transported to CT 

## 2015-02-14 NOTE — ED Provider Notes (Signed)
[facility] Emergency Department Provider Note  ____________________________________________  Time seen: On EMS arrival  I have reviewed the triage vital signs and the nursing notes.   HISTORY  Chief Complaint Cardiac Arrest  Caveat-history of present illness and review of systems is limited due to the patient's severity of illness. All information is obtained from EMS on their arrival as well as the patient's wife.  HPI Gregory Chase is a 50 y.o. male with history of CKD, CHF with EF of less than 20% who presents status post cardiac arrest. According to his wife, the patient was in his usual state of health this evening, he went to the bathroom. His wife heard a side and went into the bathroom and found him unconscious on the floor. She called 911. On her eyeball the fire department, CPR was initiated and the patient was noted to be in a shockable rhythm. He received one shock after which he regained pulses. On EMS arrival, he lost pulses and required 2 rounds of epinephrine as well as CPR. The patient regained his pulse however in the ambulance, heart rate was noted to be 60 without a pulse and the patient again received 2 rounds of epinephrine and compressions after which she regained a pulse. Blood glucose was 141. Reports that he has not been ill recently, not complaining of chest pain, shortness of breath, vomiting or diarrhea.   Past Medical History  Diagnosis Date  . CHF (congestive heart failure) [facility])     Patient Active Problem List   Diagnosis Date Noted  . CKD (chronic kidney disease) stage 3, GFR 30-59 ml/min 10/31/2014  . Acute on chronic systolic CHF (congestive heart failure) (HCC) 10/31/2014  . Malignant essential hypertension 10/30/2014  . Abdominal pain, left lower quadrant 10/30/2014  . Constipation 10/30/2014  . Left bundle branch block 10/30/2014  . Acute respiratory failure with hypoxia (HCC) 10/30/2014    No past surgical  history on file.  No current outpatient prescriptions on file.  Allergies Bee venom and Ace inhibitors  No family history on file.  Social History Social History  Substance Use Topics  . Smoking status: Never Smoker   . Smokeless tobacco: Not on file  . Alcohol Use: No    Review of Systems   Caveat-history of present illness and review of systems is limited due to the patient's severity of illness. All information is obtained from EMS on their arrival as well as the patient's wife. ____________________________________________   PHYSICAL EXAM:  VITAL SIGNS: ED Triage Vitals  Enc Vitals Group     BP 02/14/15 2230 170/106 mmHg     Pulse Rate 02/14/15 2230 93     Resp 02/14/15 2230 20     Temp 02/14/15 2230 97.6 F (36.4 C)     Temp src --      SpO2 02/14/15 2230 100 %     Weight --      Height --      Head Cir --      Peak Flow --      Pain Score --      Pain Loc --      Pain Edu? --      Excl. in GC? --     Constitutional: Patient does not respond to noxious stimuli in any extremity. No verbalizations. Agonal respirations. Eyes: Conjunctivae are normal. Pupils 1-2 mm bilaterally, sluggishly reactive. Head: Atraumatic. Nose: No congestion/rhinnorhea. Mouth/Throat: Mucous membranes are moist.  Oropharynx non-erythematous. Neck: No stridor.  No apparent cervical spine tenderness to palpation. Cardiovascular: Normal rate, regular rhythm. Grossly normal heart sounds.  Respiratory: Coarse breath sounds bilaterally. Gastrointestinal: Soft and nontender. No distention. Genitourinary: Deferred Musculoskeletal: No lower extremity tenderness nor edema.  No joint effusions. Neurologic:  GCS 3. No response to noxious stimuli, no verbalization. Eyes closed.  ____________________________________________   LABS (all labs ordered are listed, but only abnormal results are displayed)  Labs Reviewed  CBC WITH DIFFERENTIAL/PLATELET - Abnormal; Notable for the following:     WBC 16.5 (*)    RBC 6.02 (*)    MCH 25.5 (*)    MCHC 30.9 (*)    RDW 16.9 (*)    Lymphs Abs 9.5 (*)    Monocytes Absolute 1.2 (*)    All other components within normal limits  COMPREHENSIVE METABOLIC PANEL - Abnormal; Notable for the following:    Potassium 3.4 (*)    Glucose, Bld 201 (*)    BUN 23 (*)    Creatinine, Ser 2.11 (*)    AST 99 (*)    ALT 104 (*)    GFR calc non Af Amer 35 (*)    GFR calc Af Amer 40 (*)    All other components within normal limits  TROPONIN I - Abnormal; Notable for the following:    Troponin I 0.07 (*)    All other components within normal limits  PROTIME-INR - Abnormal; Notable for the following:    Prothrombin Time 15.2 (*)    All other components within normal limits  BRAIN NATRIURETIC PEPTIDE - Abnormal; Notable for the following:    B Natriuretic Peptide 323.0 (*)    All other components within normal limits  LACTIC ACID, PLASMA - Abnormal; Notable for the following:    Lactic Acid, Venous 3.1 (*)    All other components within normal limits  TSH - Abnormal; Notable for the following:    TSH 6.417 (*)    All other components within normal limits  BLOOD GAS, ARTERIAL - Abnormal; Notable for the following:    pH, Arterial 7.25 (*)    pO2, Arterial 231 (*)    Acid-base deficit 6.4 (*)    Allens test (pass/fail) POSITIVE (*)    All other components within normal limits  APTT  ETHANOL  T4, FREE  TRIGLYCERIDES  LACTIC ACID, PLASMA   ____________________________________________  EKG  ED ECG REPORT I, Gayla Doss, the attending physician, personally viewed and interpreted this ECG.   Date: 02/14/2015  EKG Time: 22:02  Rate: 67  Rhythm: Junctional rhythm  Axis: normal  Intervals:right bundle branch block, LAFB  ST&T Change: No acute ST elevation. LVH  ED ECG REPORT I, Gayla Doss, the attending physician, personally viewed and interpreted this ECG.   Date: 02/14/2015  EKG Time: 22:32  Rate: 96  Rhythm: normal sinus  rhythm  Axis: normal  Intervals: Left bundle branch block.  ST&T Change: No acute ST elevation.   ____________________________________________  RADIOLOGY  CXR  FINDINGS: Chronic cardiopericardial enlargement. Unchanged aortic and hilar contours when allowing for differences in technique. Endotracheal tube with tip between the clavicular heads and carina. There is an orogastric tube which reaches the stomach at least. The orogastric tube is tortuous, likely from left atrial enlargement. Pulmonary venous congestion with cephalized blood flow, at least partially related to supine positioning. No overt edema, effusion, or pneumothorax. Rounded lucency over the right chest wall is likely artifactual. No convincing subcutaneous gas. No visible fracture.  IMPRESSION: 1. Located endotracheal  and orogastric tubes. 2. Chronic cardiomegaly with venous congestion.  CT head and C-spine IMPRESSION: 1. 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 2. No acute intracranial abnormalities. 3. Cervical spondylosis without evidence for fracture or subluxation.  ____________________________________________   PROCEDURES  Procedure(s) performed: None   INTUBATION Performed by: Toney Rakes A  Required items: required blood products, implants, devices, and special equipment available Patient identity confirmed: provided demographic data and hospital-assigned identification number Time out: Immediately prior to procedure a "time out" was called to verify the correct patient, procedure, equipment, support staff and site/side marked as required.  Indications: airway protection, ventilation  Intubation method: Glidescope Laryngoscopy   Preoxygenation: BVM  Sedatives: Etomidate Paralytic: Succinylcholine  Tube Size: 8.0 cuffed  Post-procedure assessment: chest rise and ETCO2 monitor Breath sounds: equal and absent over the epigastrium Tube secured with: ETT  holder Chest x-ray interpreted by radiologist and me.  Chest x-ray findings: endotracheal tube in appropriate position  Patient tolerated the procedure well with no immediate complications.    Critical Care performed: Yes, see critical care note(s). Total critical care time spent 45 minutes.  ____________________________________________   INITIAL IMPRESSION / ASSESSMENT AND PLAN / ED COURSE  Pertinent labs & imaging results that were available during my care of the patient were reviewed by me and considered in my medical decision making (see chart for details).  Gregory Chase is a 50 y.o. male with history of CKD, CHF with EF of less than 20% who presents status post cardiac arrest. On arrival to the emergency department, he was noted to have a pulse and he was hypertensive. Dopamine drip was initiated for inotropic support. IV fluids given. The patient was intubated for airway protection. Labs, chest x-ray, CT head and C-spine pending. EKG does not directly meet STEMI criteria but his clinical picture is concerning so will discuss the case with [facility] as he may require catheterization this evening.  ----------------------------------------- 10:35PM on 02/14/2015 ----------------------------------------- Case discussed with Dr. Peter Sw[place] of  [facility] who has reviewed the EKG, does not recommend initiation of code STEMI being initiated. No STEMI currently however as the patient had a witnessed cardiac arrest with initial shockable rhythm, he should be in a facility where catheterization can be performed tonight if deemed necessary by cardiology therefore have discussed the case with Dr. Kendrick Fries,  [facility] Intensivist, and he will accept the patient.  ____________________________________________   FINAL CLINICAL IMPRESSION(S) / ED DIAGNOSES  Final diagnoses:  Cardiac arrest (HCC)      Gayla Doss, MD 02/14/2015 [phone]

## 2015-02-15 ENCOUNTER — Inpatient Hospital Stay (HOSPITAL_COMMUNITY): Payer: Managed Care, Other (non HMO)

## 2015-02-15 ENCOUNTER — Inpatient Hospital Stay (HOSPITAL_COMMUNITY)
Admission: AD | Admit: 2015-02-15 | Discharge: 2015-03-13 | DRG: 308 | Disposition: E | Payer: Managed Care, Other (non HMO) | Source: Other Acute Inpatient Hospital | Attending: Pulmonary Disease | Admitting: Pulmonary Disease

## 2015-02-15 ENCOUNTER — Encounter (HOSPITAL_COMMUNITY): Payer: Self-pay

## 2015-02-15 DIAGNOSIS — J9602 Acute respiratory failure with hypercapnia: Secondary | ICD-10-CM | POA: Diagnosis present

## 2015-02-15 DIAGNOSIS — G4733 Obstructive sleep apnea (adult) (pediatric): Secondary | ICD-10-CM | POA: Diagnosis present

## 2015-02-15 DIAGNOSIS — N183 Chronic kidney disease, stage 3 (moderate): Secondary | ICD-10-CM | POA: Diagnosis present

## 2015-02-15 DIAGNOSIS — I480 Paroxysmal atrial fibrillation: Secondary | ICD-10-CM | POA: Diagnosis present

## 2015-02-15 DIAGNOSIS — G936 Cerebral edema: Secondary | ICD-10-CM | POA: Diagnosis present

## 2015-02-15 DIAGNOSIS — E872 Acidosis: Secondary | ICD-10-CM | POA: Diagnosis present

## 2015-02-15 DIAGNOSIS — I130 Hypertensive heart and chronic kidney disease with heart failure and stage 1 through stage 4 chronic kidney disease, or unspecified chronic kidney disease: Secondary | ICD-10-CM | POA: Diagnosis present

## 2015-02-15 DIAGNOSIS — I462 Cardiac arrest due to underlying cardiac condition: Secondary | ICD-10-CM | POA: Diagnosis present

## 2015-02-15 DIAGNOSIS — I429 Cardiomyopathy, unspecified: Secondary | ICD-10-CM

## 2015-02-15 DIAGNOSIS — G40901 Epilepsy, unspecified, not intractable, with status epilepticus: Secondary | ICD-10-CM | POA: Diagnosis present

## 2015-02-15 DIAGNOSIS — I5043 Acute on chronic combined systolic (congestive) and diastolic (congestive) heart failure: Secondary | ICD-10-CM

## 2015-02-15 DIAGNOSIS — I472 Ventricular tachycardia: Secondary | ICD-10-CM | POA: Diagnosis present

## 2015-02-15 DIAGNOSIS — J9601 Acute respiratory failure with hypoxia: Secondary | ICD-10-CM | POA: Diagnosis present

## 2015-02-15 DIAGNOSIS — G9340 Encephalopathy, unspecified: Secondary | ICD-10-CM | POA: Insufficient documentation

## 2015-02-15 DIAGNOSIS — I428 Other cardiomyopathies: Secondary | ICD-10-CM | POA: Insufficient documentation

## 2015-02-15 DIAGNOSIS — Z452 Encounter for adjustment and management of vascular access device: Secondary | ICD-10-CM | POA: Insufficient documentation

## 2015-02-15 DIAGNOSIS — E669 Obesity, unspecified: Secondary | ICD-10-CM | POA: Diagnosis present

## 2015-02-15 DIAGNOSIS — Z66 Do not resuscitate: Secondary | ICD-10-CM | POA: Diagnosis present

## 2015-02-15 DIAGNOSIS — R4020 Unspecified coma: Secondary | ICD-10-CM | POA: Diagnosis present

## 2015-02-15 DIAGNOSIS — Z6838 Body mass index (BMI) 38.0-38.9, adult: Secondary | ICD-10-CM

## 2015-02-15 DIAGNOSIS — I4901 Ventricular fibrillation: Secondary | ICD-10-CM | POA: Diagnosis present

## 2015-02-15 DIAGNOSIS — I959 Hypotension, unspecified: Secondary | ICD-10-CM | POA: Diagnosis present

## 2015-02-15 DIAGNOSIS — Z9289 Personal history of other medical treatment: Secondary | ICD-10-CM

## 2015-02-15 DIAGNOSIS — E876 Hypokalemia: Secondary | ICD-10-CM | POA: Diagnosis present

## 2015-02-15 DIAGNOSIS — G253 Myoclonus: Secondary | ICD-10-CM

## 2015-02-15 DIAGNOSIS — N179 Acute kidney failure, unspecified: Secondary | ICD-10-CM | POA: Diagnosis present

## 2015-02-15 DIAGNOSIS — G931 Anoxic brain damage, not elsewhere classified: Secondary | ICD-10-CM | POA: Diagnosis present

## 2015-02-15 DIAGNOSIS — I469 Cardiac arrest, cause unspecified: Secondary | ICD-10-CM | POA: Diagnosis present

## 2015-02-15 DIAGNOSIS — E46 Unspecified protein-calorie malnutrition: Secondary | ICD-10-CM | POA: Diagnosis present

## 2015-02-15 DIAGNOSIS — T148XXA Other injury of unspecified body region, initial encounter: Secondary | ICD-10-CM

## 2015-02-15 DIAGNOSIS — R739 Hyperglycemia, unspecified: Secondary | ICD-10-CM | POA: Diagnosis present

## 2015-02-15 DIAGNOSIS — Z515 Encounter for palliative care: Secondary | ICD-10-CM | POA: Diagnosis present

## 2015-02-15 HISTORY — DX: Essential (primary) hypertension: I10

## 2015-02-15 HISTORY — DX: Other specified postprocedural states: Z98.890

## 2015-02-15 LAB — APTT
APTT: 28 s (ref 24–37)
aPTT: 25 seconds (ref 24–37)

## 2015-02-15 LAB — CBC WITH DIFFERENTIAL/PLATELET
BASOS PCT: 0 %
Band Neutrophils: 0 %
Basophils Absolute: 0 10*3/uL (ref 0–0.1)
Blasts: 0 %
EOS PCT: 0 %
Eosinophils Absolute: 0 10*3/uL (ref 0–0.7)
HEMATOCRIT: 49.8 % (ref 40.0–52.0)
Hemoglobin: 15.4 g/dL (ref 13.0–18.0)
LYMPHS ABS: 9.5 10*3/uL — AB (ref 1.0–3.6)
Lymphocytes Relative: 58 %
MCH: 25.5 pg — ABNORMAL LOW (ref 26.0–34.0)
MCHC: 30.9 g/dL — AB (ref 32.0–36.0)
MCV: 82.7 fL (ref 80.0–100.0)
METAMYELOCYTES PCT: 0 %
MONO ABS: 1.2 10*3/uL — AB (ref 0.2–1.0)
MONOS PCT: 7 %
MYELOCYTES: 0 %
NEUTROS ABS: 5.8 10*3/uL (ref 1.4–6.5)
NEUTROS PCT: 35 %
NRBC: 0 /100{WBCs}
Other: 0 %
PLATELETS: 224 10*3/uL (ref 150–440)
Promyelocytes Absolute: 0 %
RBC: 6.02 MIL/uL — AB (ref 4.40–5.90)
RDW: 16.9 % — AB (ref 11.5–14.5)
WBC: 16.5 10*3/uL — AB (ref 3.8–10.6)

## 2015-02-15 LAB — GLUCOSE, CAPILLARY
GLUCOSE-CAPILLARY: 100 mg/dL — AB (ref 65–99)
GLUCOSE-CAPILLARY: 132 mg/dL — AB (ref 65–99)
GLUCOSE-CAPILLARY: 131 mg/dL — AB (ref 65–99)
GLUCOSE-CAPILLARY: 138 mg/dL — AB (ref 65–99)
GLUCOSE-CAPILLARY: 123 mg/dL — AB (ref 65–99)
GLUCOSE-CAPILLARY: 119 mg/dL — AB (ref 65–99)
Glucose-Capillary: 101 mg/dL — ABNORMAL HIGH (ref 65–99)
Glucose-Capillary: 89 mg/dL (ref 65–99)
Glucose-Capillary: 156 mg/dL — ABNORMAL HIGH (ref 65–99)
Glucose-Capillary: 139 mg/dL — ABNORMAL HIGH (ref 65–99)
Glucose-Capillary: 122 mg/dL — ABNORMAL HIGH (ref 65–99)

## 2015-02-15 LAB — LACTIC ACID, PLASMA
LACTIC ACID, VENOUS: 1.8 mmol/L (ref 0.5–2.0)
LACTIC ACID, VENOUS: 1.7 mmol/L (ref 0.5–2.0)
LACTIC ACID, VENOUS: 1.6 mmol/L (ref 0.5–2.0)
Lactic Acid, Venous: 3.1 mmol/L (ref 0.5–2.0)

## 2015-02-15 LAB — POCT I-STAT, CHEM 8
BUN: 28 mg/dL — AB (ref 6–20)
BUN: 29 mg/dL — ABNORMAL HIGH (ref 6–20)
BUN: 28 mg/dL — AB (ref 6–20)
BUN: 26 mg/dL — ABNORMAL HIGH (ref 6–20)
BUN: 26 mg/dL — AB (ref 6–20)
BUN: 27 mg/dL — AB (ref 6–20)
BUN: 26 mg/dL — ABNORMAL HIGH (ref 6–20)
CALCIUM ION: 1.26 mmol/L — AB (ref 1.12–1.23)
CALCIUM ION: 1.28 mmol/L — AB (ref 1.12–1.23)
CALCIUM ION: 1.22 mmol/L (ref 1.12–1.23)
CALCIUM ION: 1.19 mmol/L (ref 1.12–1.23)
CALCIUM ION: 1.2 mmol/L (ref 1.12–1.23)
CALCIUM ION: 1.21 mmol/L (ref 1.12–1.23)
CHLORIDE: 103 mmol/L (ref 101–111)
CHLORIDE: 105 mmol/L (ref 101–111)
CHLORIDE: 106 mmol/L (ref 101–111)
CREATININE: 2 mg/dL — AB (ref 0.61–1.24)
CREATININE: 1.9 mg/dL — AB (ref 0.61–1.24)
CREATININE: 1.7 mg/dL — AB (ref 0.61–1.24)
CREATININE: 1.6 mg/dL — AB (ref 0.61–1.24)
Calcium, Ion: 1.22 mmol/L (ref 1.12–1.23)
Chloride: 106 mmol/L (ref 101–111)
Chloride: 105 mmol/L (ref 101–111)
Chloride: 105 mmol/L (ref 101–111)
Chloride: 106 mmol/L (ref 101–111)
Creatinine, Ser: 1.8 mg/dL — ABNORMAL HIGH (ref 0.61–1.24)
Creatinine, Ser: 1.7 mg/dL — ABNORMAL HIGH (ref 0.61–1.24)
Creatinine, Ser: 1.6 mg/dL — ABNORMAL HIGH (ref 0.61–1.24)
GLUCOSE: 113 mg/dL — AB (ref 65–99)
GLUCOSE: 138 mg/dL — AB (ref 65–99)
GLUCOSE: 157 mg/dL — AB (ref 65–99)
GLUCOSE: 172 mg/dL — AB (ref 65–99)
GLUCOSE: 119 mg/dL — AB (ref 65–99)
GLUCOSE: 141 mg/dL — AB (ref 65–99)
GLUCOSE: 137 mg/dL — AB (ref 65–99)
HCT: 50 % (ref 39.0–52.0)
HCT: 53 % — ABNORMAL HIGH (ref 39.0–52.0)
HCT: 53 % — ABNORMAL HIGH (ref 39.0–52.0)
HCT: 52 % (ref 39.0–52.0)
HCT: 51 % (ref 39.0–52.0)
HCT: 51 % (ref 39.0–52.0)
HEMATOCRIT: 52 % (ref 39.0–52.0)
HEMOGLOBIN: 17.7 g/dL — AB (ref 13.0–17.0)
HEMOGLOBIN: 17 g/dL (ref 13.0–17.0)
HEMOGLOBIN: 18 g/dL — AB (ref 13.0–17.0)
HEMOGLOBIN: 17.3 g/dL — AB (ref 13.0–17.0)
Hemoglobin: 18 g/dL — ABNORMAL HIGH (ref 13.0–17.0)
Hemoglobin: 17.7 g/dL — ABNORMAL HIGH (ref 13.0–17.0)
Hemoglobin: 17.3 g/dL — ABNORMAL HIGH (ref 13.0–17.0)
POTASSIUM: 2.5 mmol/L — AB (ref 3.5–5.1)
POTASSIUM: 3 mmol/L — AB (ref 3.5–5.1)
POTASSIUM: 3.6 mmol/L (ref 3.5–5.1)
POTASSIUM: 3.8 mmol/L (ref 3.5–5.1)
Potassium: 2.9 mmol/L — ABNORMAL LOW (ref 3.5–5.1)
Potassium: 2.9 mmol/L — ABNORMAL LOW (ref 3.5–5.1)
Potassium: 4 mmol/L (ref 3.5–5.1)
SODIUM: 143 mmol/L (ref 135–145)
SODIUM: 142 mmol/L (ref 135–145)
Sodium: 143 mmol/L (ref 135–145)
Sodium: 144 mmol/L (ref 135–145)
Sodium: 142 mmol/L (ref 135–145)
Sodium: 143 mmol/L (ref 135–145)
Sodium: 142 mmol/L (ref 135–145)
TCO2: 26 mmol/L (ref 0–100)
TCO2: 22 mmol/L (ref 0–100)
TCO2: 21 mmol/L (ref 0–100)
TCO2: 22 mmol/L (ref 0–100)
TCO2: 21 mmol/L (ref 0–100)
TCO2: 20 mmol/L (ref 0–100)
TCO2: 21 mmol/L (ref 0–100)

## 2015-02-15 LAB — BASIC METABOLIC PANEL
ANION GAP: 13 (ref 5–15)
BUN: 25 mg/dL — AB (ref 6–20)
CHLORIDE: 106 mmol/L (ref 101–111)
CO2: 22 mmol/L (ref 22–32)
Calcium: 8.9 mg/dL (ref 8.9–10.3)
Creatinine, Ser: 2.2 mg/dL — ABNORMAL HIGH (ref 0.61–1.24)
GFR calc non Af Amer: 33 mL/min — ABNORMAL LOW (ref >60)
GFR, EST AFRICAN AMERICAN: 38 mL/min — AB (ref >60)
Glucose, Bld: 109 mg/dL — ABNORMAL HIGH (ref 65–99)
POTASSIUM: 3.2 mmol/L — AB (ref 3.5–5.1)
SODIUM: 141 mmol/L (ref 135–145)

## 2015-02-15 LAB — MRSA PCR SCREENING: MRSA by PCR: NEGATIVE

## 2015-02-15 LAB — PROTIME-INR
INR: 1.2 (ref 0.00–1.49)
INR: 1.21 (ref 0.00–1.49)
INR: 1.18 (ref 0.00–1.49)
PROTHROMBIN TIME: 15.3 s — AB (ref 11.6–15.2)
PROTHROMBIN TIME: 15.5 s — AB (ref 11.6–15.2)
PROTHROMBIN TIME: 15.2 s (ref 11.6–15.2)

## 2015-02-15 LAB — POCT I-STAT 3, ART BLOOD GAS (G3+)
Acid-base deficit: 5 mmol/L — ABNORMAL HIGH (ref 0.0–2.0)
BICARBONATE: 22.7 meq/L (ref 20.0–24.0)
O2 Saturation: 99 %
PCO2 ART: 48.5 mmHg — AB (ref 35.0–45.0)
PO2 ART: 159 mmHg — AB (ref 80.0–100.0)
TCO2: 24 mmol/L (ref 0–100)
pH, Arterial: 7.278 — ABNORMAL LOW (ref 7.350–7.450)

## 2015-02-15 LAB — TROPONIN I
TROPONIN I: 0.36 ng/mL — AB (ref <0.031)
Troponin I: 0.36 ng/mL — ABNORMAL HIGH (ref <0.031)
Troponin I: 0.14 ng/mL — ABNORMAL HIGH (ref <0.031)
Troponin I: 0.09 ng/mL — ABNORMAL HIGH (ref <0.031)

## 2015-02-15 LAB — RAPID URINE DRUG SCREEN, HOSP PERFORMED
Amphetamines: NOT DETECTED
Barbiturates: NOT DETECTED
Benzodiazepines: POSITIVE — AB
Cocaine: NOT DETECTED
Opiates: NOT DETECTED
Tetrahydrocannabinol: NOT DETECTED

## 2015-02-15 LAB — PROCALCITONIN: PROCALCITONIN: 1.76 ng/mL

## 2015-02-15 LAB — TSH: TSH: 1.198 u[IU]/mL (ref 0.350–4.500)

## 2015-02-15 MED ORDER — POTASSIUM CHLORIDE 10 MEQ/50ML IV SOLN
10.0000 meq | INTRAVENOUS | Status: AC
  Administered 2015-02-15 (×4): 10 meq via INTRAVENOUS
  Filled 2015-02-15 (×4): qty 50

## 2015-02-15 MED ORDER — CHLORHEXIDINE GLUCONATE 0.12% ORAL RINSE (MEDLINE KIT)
15.0000 mL | Freq: Two times a day (BID) | OROMUCOSAL | Status: DC
  Administered 2015-02-15 – 2015-02-18 (×8): 15 mL via OROMUCOSAL

## 2015-02-15 MED ORDER — SODIUM CHLORIDE 0.9% FLUSH
10.0000 mL | Freq: Two times a day (BID) | INTRAVENOUS | Status: DC
  Administered 2015-02-15: 10 mL
  Administered 2015-02-16: 20 mL
  Administered 2015-02-16: 10 mL
  Administered 2015-02-17: 20 mL
  Administered 2015-02-17: 10 mL

## 2015-02-15 MED ORDER — ACETAMINOPHEN 650 MG RE SUPP
650.0000 mg | Freq: Four times a day (QID) | RECTAL | Status: DC | PRN
  Filled 2015-02-15: qty 1

## 2015-02-15 MED ORDER — PROPOFOL 1000 MG/100ML IV EMUL: 5.0000 ug/kg/min | INTRAVENOUS | Status: DC

## 2015-02-15 MED ORDER — ASPIRIN 300 MG RE SUPP
300.0000 mg | RECTAL | Status: AC
  Administered 2015-02-15: 300 mg via RECTAL
  Filled 2015-02-15: qty 1

## 2015-02-15 MED ORDER — SODIUM CHLORIDE 0.9 % IV SOLN
25.0000 ug/h | INTRAVENOUS | Status: DC
  Administered 2015-02-15 (×2): 400 ug/h via INTRAVENOUS
  Administered 2015-02-15: 100 ug/h via INTRAVENOUS
  Administered 2015-02-16 (×2): 400 ug/h via INTRAVENOUS
  Filled 2015-02-15 (×7): qty 50

## 2015-02-15 MED ORDER — ROCURONIUM BROMIDE 50 MG/5ML IV SOLN
80.0000 mg | Freq: Once | INTRAVENOUS | Status: AC
  Administered 2015-02-15: 80 mg via INTRAVENOUS

## 2015-02-15 MED ORDER — FENTANYL CITRATE (PF) 100 MCG/2ML IJ SOLN: 100.0000 ug | Freq: Once | INTRAMUSCULAR | Status: DC

## 2015-02-15 MED ORDER — INSULIN ASPART 100 UNIT/ML ~~LOC~~ SOLN
2.0000 [IU] | SUBCUTANEOUS | Status: DC
  Administered 2015-02-15: 2 [IU] via SUBCUTANEOUS
  Administered 2015-02-15: 4 [IU] via SUBCUTANEOUS
  Administered 2015-02-15 – 2015-02-16 (×5): 2 [IU] via SUBCUTANEOUS

## 2015-02-15 MED ORDER — NOREPINEPHRINE BITARTRATE 1 MG/ML IV SOLN
0.0000 ug/min | INTRAVENOUS | Status: DC
  Administered 2015-02-15: 2 ug/min via INTRAVENOUS
  Administered 2015-02-16: 8 ug/min via INTRAVENOUS
  Filled 2015-02-15 (×3): qty 4

## 2015-02-15 MED ORDER — SODIUM CHLORIDE 0.9 % IV SOLN
500.0000 mg | Freq: Two times a day (BID) | INTRAVENOUS | Status: DC
  Administered 2015-02-16 – 2015-02-17 (×3): 500 mg via INTRAVENOUS
  Filled 2015-02-15 (×4): qty 5

## 2015-02-15 MED ORDER — SODIUM CHLORIDE 0.9 % IV SOLN
2000.0000 mL | Freq: Once | INTRAVENOUS | Status: DC
Start: 2015-02-15 — End: 2015-02-18

## 2015-02-15 MED ORDER — CISATRACURIUM BOLUS VIA INFUSION
0.0500 mg/kg | INTRAVENOUS | Status: DC | PRN
  Filled 2015-02-15: qty 8

## 2015-02-15 MED ORDER — ARTIFICIAL TEARS OP OINT
1.0000 "application " | TOPICAL_OINTMENT | Freq: Three times a day (TID) | OPHTHALMIC | Status: DC
  Administered 2015-02-15 – 2015-02-17 (×8): 1 via OPHTHALMIC
  Filled 2015-02-15: qty 3.5

## 2015-02-15 MED ORDER — SODIUM CHLORIDE 0.9 % IV SOLN
1000.0000 mg | Freq: Once | INTRAVENOUS | Status: AC
  Administered 2015-02-15: 1000 mg via INTRAVENOUS
  Filled 2015-02-15: qty 10

## 2015-02-15 MED ORDER — SODIUM CHLORIDE 0.9% FLUSH: 10.0000 mL | INTRAVENOUS | Status: DC | PRN

## 2015-02-15 MED ORDER — HEPARIN SODIUM (PORCINE) 5000 UNIT/ML IJ SOLN
5000.0000 [IU] | Freq: Three times a day (TID) | INTRAMUSCULAR | Status: DC
Start: 2015-02-15 — End: 2015-02-18
  Administered 2015-02-15 – 2015-02-18 (×9): 5000 [IU] via SUBCUTANEOUS
  Filled 2015-02-15 (×7): qty 1

## 2015-02-15 MED ORDER — PANTOPRAZOLE SODIUM 40 MG IV SOLR
40.0000 mg | Freq: Every day | INTRAVENOUS | Status: DC
  Administered 2015-02-15 – 2015-02-17 (×4): 40 mg via INTRAVENOUS
  Filled 2015-02-15 (×4): qty 40

## 2015-02-15 MED ORDER — SODIUM CHLORIDE 0.9 % IV SOLN
1.0000 mg/h | INTRAVENOUS | Status: DC
  Administered 2015-02-15 (×2): 5 mg/h via INTRAVENOUS
  Administered 2015-02-15: 2 mg/h via INTRAVENOUS
  Administered 2015-02-16: 5 mg/h via INTRAVENOUS
  Filled 2015-02-15 (×5): qty 10

## 2015-02-15 MED ORDER — CISATRACURIUM BOLUS VIA INFUSION
0.1000 mg/kg | Freq: Once | INTRAVENOUS | Status: AC
  Administered 2015-02-15: 14.1 mg via INTRAVENOUS
  Filled 2015-02-15: qty 15

## 2015-02-15 MED ORDER — ANTISEPTIC ORAL RINSE SOLUTION (CORINZ)
7.0000 mL | OROMUCOSAL | Status: DC
  Administered 2015-02-15 – 2015-02-18 (×32): 7 mL via OROMUCOSAL

## 2015-02-15 MED ORDER — CISATRACURIUM BESYLATE (PF) 200 MG/20ML IV SOLN
1.0000 ug/kg/min | INTRAVENOUS | Status: DC
  Administered 2015-02-15: 1.2 ug/kg/min via INTRAVENOUS
  Administered 2015-02-15: 1 ug/kg/min via INTRAVENOUS
  Filled 2015-02-15 (×2): qty 20

## 2015-02-15 MED ORDER — AMIODARONE HCL 200 MG PO TABS
200.0000 mg | ORAL_TABLET | Freq: Every day | ORAL | Status: DC
  Administered 2015-02-15 – 2015-02-18 (×4): 200 mg
  Filled 2015-02-15 (×4): qty 1

## 2015-02-15 MED ORDER — FENTANYL BOLUS VIA INFUSION
50.0000 ug | INTRAVENOUS | Status: DC | PRN
  Filled 2015-02-15: qty 50

## 2015-02-15 MED ORDER — ALBUTEROL SULFATE (2.5 MG/3ML) 0.083% IN NEBU: 2.5000 mg | INHALATION_SOLUTION | RESPIRATORY_TRACT | Status: DC | PRN

## 2015-02-15 NOTE — Progress Notes (Signed)
Initial Nutrition Assessment  DOCUMENTATION CODES:   Obesity unspecified  INTERVENTION:   Once rewarmed recommend: Initiate Vital High Protein @ 25 ml/hr via OG tube and increase by 10 ml every 4 hours to goal rate of 55 ml/hr.   60 ml Prostat BID.    Tube feeding regimen provides 1720 kcal, 175 grams of protein, and 1103 ml of H2O.   NUTRITION DIAGNOSIS:    Inadequate oral intake related to inability to eat as evidenced by NPO status.  GOAL:   Provide needs based on ASPEN/SCCM guidelines  MONITOR:   Vent status, Labs, I & O's  REASON FOR ASSESSMENT:   Ventilator   ASSESSMENT:   50 y.o. 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 He had recent admission to [facility] 10/30/14 through 11/01/14 for AoC systolic and diastolic CHF. Admitted with cardiac arrest.   Patient is currently intubated on ventilator support MV: 8.9 L/min Temp (24hrs), Avg:95.9 F (35.5 C), Min:90 F (32.2 C), Max:99 F (37.2 C)  Medications reviewed and include: KCl Labs reviewed: potassium low 3.0 OG tube no xray [facility] Protocol, rewarming to begin 8 am 2/4 Nutrition-Focused physical exam completed. Findings are no fat depletion, no muscle depletion, and no edema.    Diet Order:    NPO  Skin:  Reviewed, no issues  Last BM:  unknown  Height:   Ht Readings from Last 1 Encounters:  02/21/2015 6\' 1"  (1.854 m)   Weight:   Wt Readings from Last 1 Encounters:  02/14/2015 289 lb 14.5 oz (131.5 kg)   Ideal Body Weight:  83.6 kg  BMI:  Body mass index is 38.26 kg/(m^2).  Estimated Nutritional Needs:   Kcal:  2641-5830  Protein:  >167 grams  Fluid:  > 1.5 L/day  EDUCATION NEEDS:   No education needs identified at this time  Kendell Bane RD, LDN, CNSC [phone] Pager [phone] After Hours Pager

## 2015-02-15 NOTE — Progress Notes (Signed)
Elink MD made aware that patient did not reach target temperature within desired time frame. Patient given Tylenol suppository, as well as ice packs applied to get temperature to come down. Will continue to monitor patient. Milon Dikes, RN

## 2015-02-15 NOTE — Consult Note (Addendum)
Admission H&P    Chief Complaint: Acute encephalopathy associated with cardiac arrest.  HPI: Gregory Chase is an 50 y.o. male with a history of systolic and diastolic heart failure and obstructive sleep apnea who reportedly lost consciousness at home and possibly experienced a generalized seizure. Patient had subsequent cardiac arrest 2. Total resuscitation time was about 30 minutes. Patient has remained unconscious. He also exhibited myoclonic-like activity of extremities bilaterally, thought to possibly be seizure activity. This activity stopped when he was given a muscle paralyzing agent. He has no history of seizure disorder. He has been started on hypothermia protocol. 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  Past Medical History  Diagnosis Date  . CHF (congestive heart failure) (HCC)     No past surgical history on file.  No family history on file. Social History:  reports that he has never smoked. He does not have any smokeless tobacco history on file. He reports that he does not drink alcohol or use illicit drugs.  Allergies:  Allergies  Allergen Reactions  . Bee Venom Swelling  . Ace Inhibitors     Medications Prior to Admission  Medication Sig Dispense Refill  . allopurinol (ZYLOPRIM) 300 MG tablet Take 1 tablet (300 mg total) by mouth daily. 30 tablet 12  . amiodarone (PACERONE) 200 MG tablet Take 1 tablet (200 mg total) by mouth daily. 30 tablet 5  . carvedilol (COREG) 6.25 MG tablet Take 1 tablet (6.25 mg total) by mouth 2 (two) times daily with a meal. 60 tablet 6  . digoxin (LANOXIN) 0.125 MG tablet Take 0.125 mg by mouth daily.    . furosemide (LASIX) 40 MG tablet Take 1 tablet (40 mg total) by mouth daily. 30 tablet 6  . sacubitril-valsartan (ENTRESTO) 24-26 MG Take 1 tablet by mouth 2 (two) times daily. 60 tablet 6    ROS: Unavailable due to encephalopathic state.  Physical Examination: Blood  pressure 126/109, pulse 132, temperature 97.2 F (36.2 C), temperature source Core (Comment), resp. rate 27, SpO2 100 %.  HEENT-  Normocephalic, no lesions, without obvious abnormality.  Normal external eye and conjunctiva.  Normal TM's bilaterally.  Normal auditory canals and external ears. Normal external nose, mucus membranes and septum.  Normal pharynx. Neck supple with no masses, nodes, nodules or enlargement. Cardiovascular - regular rate and rhythm, S1, S2 normal, no murmur, click, rub or gallop Lungs - chest clear, no wheezing, rales, normal symmetric air entry Abdomen - soft, non-tender; bowel sounds normal; no masses,  no organomegaly Extremities - no joint deformities, effusion, or inflammation  Neurologic Examination: Patient was intubated and on mechanical ventilation. He was also sedated with fentanyl and Nimbex. No spontaneous respirations were noted. Pupils were equal and did not react to light. Tracker movements are absent with oculocephalic maneuvers. Corneal reflex was absent bilaterally. Face was symmetrical with no focal weakness. Muscle tone was flaccid throughout. No abnormal posturing was noted. No spontaneous activity of extremities was noted. Deep tendon reflexes were absent throughout. Laboratory responses were mute.  Results for orders placed or performed during the hospital encounter of 02/17/2015 (from the past 48 hour(s))  MRSA PCR Screening     Status: None   Collection Time: 02/17/2015 12:49 AM  Result Value Ref Range   MRSA by PCR NEGATIVE NEGATIVE    Comment:        The GeneXpert MRSA Assay (FDA approved for NASAL specimens only), is one component of  a comprehensive MRSA colonization surveillance program. It is not intended to diagnose MRSA infection nor to guide or monitor treatment for MRSA infections.   I-STAT 3, arterial blood gas (G3+)     Status: Abnormal   Collection Time: 20-Feb-2015  2:09 AM  Result Value Ref Range   pH, Arterial 7.278 (L)  7.350 - 7.450   pCO2 arterial 48.5 (H) 35.0 - 45.0 mmHg   pO2, Arterial 159.0 (H) 80.0 - 100.0 mmHg   Bicarbonate 22.7 20.0 - 24.0 mEq/L   TCO2 24 0 - 100 mmol/L   O2 Saturation 99.0 %   Acid-base deficit 5.0 (H) 0.0 - 2.0 mmol/L   Patient temperature 98.4 F    Collection site ARTERIAL LINE    Drawn by Operator    Sample type ARTERIAL    Ct Head Wo Contrast  02-20-2015  ADDENDUM REPORT: 2015/02/20 00:01 IMPRESSION: 1. 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 2. No acute intracranial abnormalities. 3. Cervical spondylosis without evidence for fracture or subluxation. Electronically Signed   By: Signa Kell M.D.   On: February 20, 2015 00:01  20-Feb-2015  CLINICAL DATA:  Cardiac arrest.  Witnessed fall. EXAM: CT HEAD WITHOUT CONTRAST CT CERVICAL SPINE WITHOUT CONTRAST TECHNIQUE: Multidetector CT imaging of the head and cervical spine was performed following the standard protocol without intravenous contrast. Multiplanar CT image reconstructions of the cervical spine were also generated. COMPARISON:  2/ 6/14 FINDINGS: CT HEAD FINDINGS Chronic right occipital infarct and left posterior parietal infarcts identified. Ventricular volumes and sulci appear normal. No abnormal extra-axial fluid collection, intracranial hemorrhage or mass identified. No acute brain infarct identified. The paranasal sinuses and the mastoid air cells are clear. The calvarium is intact. CT CERVICAL SPINE FINDINGS Normal alignment of the cervical spine. The vertebral body heights are well preserved. There is multi level disc space narrowing and ventral endplate spurring compatible with degenerative disc disease. The facet joints are well-aligned. No fractures or dislocations identified. IMPRESSION: 1. 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 2. No acute intracranial abnormalities. 3. Cervical spondylosis blow-out evidence for fracture or subluxation. Electronically Signed: By: Signa Kell M.D.  On: 02/14/2015 23:29   Ct Cervical Spine Wo Contrast  02/20/2015  ADDENDUM REPORT: 02/20/2015 00:01 IMPRESSION: 1. 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 2. No acute intracranial abnormalities. 3. Cervical spondylosis without evidence for fracture or subluxation. Electronically Signed   By: Signa Kell M.D.   On: Feb 20, 2015 00:01  02/20/2015  CLINICAL DATA:  Cardiac arrest.  Witnessed fall. EXAM: CT HEAD WITHOUT CONTRAST CT CERVICAL SPINE WITHOUT CONTRAST TECHNIQUE: Multidetector CT imaging of the head and cervical spine was performed following the standard protocol without intravenous contrast. Multiplanar CT image reconstructions of the cervical spine were also generated. COMPARISON:  2/ 6/14 FINDINGS: CT HEAD FINDINGS Chronic right occipital infarct and left posterior parietal infarcts identified. Ventricular volumes and sulci appear normal. No abnormal extra-axial fluid collection, intracranial hemorrhage or mass identified. No acute brain infarct identified. The paranasal sinuses and the mastoid air cells are clear. The calvarium is intact. CT CERVICAL SPINE FINDINGS Normal alignment of the cervical spine. The vertebral body heights are well preserved. There is multi level disc space narrowing and ventral endplate spurring compatible with degenerative disc disease. The facet joints are well-aligned. No fractures or dislocations identified. IMPRESSION: 1. 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 2. No acute intracranial abnormalities. 3. Cervical spondylosis blow-out evidence for fracture or subluxation. Electronically Signed: By: Signa Kell  M.D. On: 02/14/2015 23:29   Dg Chest Port 1 View  03-17-2015  CLINICAL DATA:  Central line placement. EXAM: PORTABLE CHEST 1 VIEW COMPARISON:  Yesterday at 2220 hour FINDINGS: Endotracheal tube 2.6 cm from the carina. Enteric tube is seen, at least to the level of the stomach. No central line. Review of electronic  records demonstrates a right femoral line was placed. Lung volumes are low. Cardiomegaly and vascular congestion are unchanged. No pneumothorax. No large pleural effusion or developing airspace disease. IMPRESSION: 1. No central line visible in the thorax. Correlation with site of central line placement recommended. 2. Endotracheal and enteric tubes in place. 3. Low lung volumes with stable cardiomegaly. Electronically Signed   By: Rubye Oaks M.D.   On: 17-Mar-2015 02:22   Dg Chest Portable 1 View  02/14/2015  CLINICAL DATA:  Intubation.  Code. EXAM: PORTABLE CHEST 1 VIEW COMPARISON:  10/30/2014 FINDINGS: Chronic cardiopericardial enlargement. Unchanged aortic and hilar contours when allowing for differences in technique. Endotracheal tube with tip between the clavicular heads and carina. There is an orogastric tube which reaches the stomach at least. The orogastric tube is tortuous, likely from left atrial enlargement. Pulmonary venous congestion with cephalized blood flow, at least partially related to supine positioning. No overt edema, effusion, or pneumothorax. Rounded lucency over the right chest wall is likely artifactual. No convincing subcutaneous gas. No visible fracture. IMPRESSION: 1. Located endotracheal and orogastric tubes. 2. Chronic cardiomegaly with venous congestion. Electronically Signed   By: Marnee Spring M.D.   On: 02/14/2015 22:55    Assessment/Plan 50 year old man with a history of systolic and diastolic heart failure admitted following reacted arrest 2, as well as possible generalized seizure at onset of this acute illness. Recurrent/continued subclinical seizure activity cannot be ruled out. Diffuse acute anoxic brain injury is suspected. Prognosis is guarded at this point.  Recommendations: 1. Continue Keppra; will increase to 1000 mg every 12 hours 2. Stat EEG to rule out subclinical seizure activity. 3. Repeat CT scan of the head without contrast following completion  of hypothermia treatment  We will continue to follow this patient closely with you.  This patient is critically ill and at significant risk of neurological worsening or death, and care requires constant monitoring of vital signs, hemodynamics,respiratory and cardiac monitoring, neurological assessment, discussion with family, other specialists and medical decision making of high complexity. Total critical care time was 50 minutes.  C.R. Roseanne Reno, MD [facility]  [phone]  03/17/2015, 3:16 AM

## 2015-02-15 NOTE — Care Management Note (Signed)
Case Management Note  Patient Details  Name: Gregory Chase MRN: [id number] Date of Birth: January 17, 1965  Subjective/Objective:  Adm w cardiac arrest                  Action/Plan:  Lives w wife   Expected Discharge Date:                  Expected Discharge Plan:     In-House Referral:     Discharge planning Services     Post Acute Care Choice:    Choice offered to:     DME Arranged:    DME Agency:     HH Arranged:    HH Agency:     Status of Service:     Medicare Important Message Given:    Date Medicare IM Given:    Medicare IM give by:    Date Additional Medicare IM Given:    Additional Medicare Important Message give by:     If discussed at Long Length of Stay Meetings, dates discussed:    Additional Comments: ur review done  Hanley Hays, RN 03-17-2015, 7:27 AM

## 2015-02-15 NOTE — Progress Notes (Signed)
eLink Physician-Brief Progress Note Patient Name: Gregory Chase DOB: March 11, 1965 MRN: [id number]   Date of Service  03-16-2015  HPI/Events of Note  Hypokalemia  eICU Interventions  Potassium replaced     Intervention Category Intermediate Interventions: Electrolyte abnormality - evaluation and management  DETERDING,ELIZABETH 03/16/2015, 4:12 AM

## 2015-02-15 NOTE — Plan of Care (Signed)
Problem: Phase I Progression Outcomes Goal: Cool target temp reached 2 hrs from start Outcome: Not Met (add Reason) Target temperature met at 0800 on 03/07/2015

## 2015-02-15 NOTE — H&P (Signed)
PULMONARY / CRITICAL CARE MEDICINE   Name: Gregory Chase MRN: [id number] DOB: [birth year]    ADMISSION DATE:  02/14/2015 CONSULTATION DATE:  03/09/2015  REFERRING MD:  [facility]  CHIEF COMPLAINT:  Cardiac Arrest.  HISTORY OF PRESENT ILLNESS:  Pt is encephelopathic; therefore, this HPI is obtained from chart review. Gregory Chase is a 50 y.o. 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  He had recent admission to [facility] 10/30/14 through 11/01/14 for AoC systolic and diastolic CHF.  On 02/02, he was brought to [facility] with cardiac arrest.  He was using restroom at home and wife heard a loud noise.  When she went to restroom, she found him on the floor in between the commode and the tub.  He was apparently unresponsive and shaking as if he were seizing.  She was unable to arouse him; therefore, she called EMS.  Firefighters were first to scene and on their arrival, pt reportedly did have a pulse.  While they were there, he lost pulses and had VT / VF arrest.  He received 1 shock and 4 epi's prior to ROSC.  Total time of ACLS was roughly 30 minutes.  In ED, he was intubated for airway protection and was then transferred to [facility] for further evaluation and management.  PAST MEDICAL HISTORY :  He  has a past medical history of CHF (congestive heart failure) (HCC).  PAST SURGICAL HISTORY: He  has no past surgical history on file.  Allergies  Allergen Reactions  . Bee Venom Swelling  . Ace Inhibitors     No current facility-administered medications on file prior to encounter.   Current Outpatient Prescriptions on File Prior to Encounter  Medication Sig  . allopurinol (ZYLOPRIM) 300 MG tablet Take 1 tablet (300 mg total) by mouth daily.  Marland Kitchen amiodarone (PACERONE) 200 MG tablet Take 1 tablet (200 mg total) by mouth daily.  . carvedilol (COREG) 6.25 MG tablet Take 1 tablet (6.25 mg total) by mouth 2 (two) times daily with a meal.  . digoxin (LANOXIN)  0.125 MG tablet Take 0.125 mg by mouth daily.  . furosemide (LASIX) 40 MG tablet Take 1 tablet (40 mg total) by mouth daily.  . sacubitril-valsartan (ENTRESTO) 24-26 MG Take 1 tablet by mouth 2 (two) times daily.    FAMILY HISTORY:  His has no family status information on file.   SOCIAL HISTORY: He  reports that he has never smoked. He does not have any smokeless tobacco history on file. He reports that he does not drink alcohol or use illicit drugs.  REVIEW OF SYSTEMS:  Unable to obtain as pt is encephalopathic.  SUBJECTIVE: On vent, non-responsive.  VITAL SIGNS: There were no vitals taken for this visit.  HEMODYNAMICS:    VENTILATOR SETTINGS: Vent Mode:  [-] AC FiO2 (%):  [50 %-100 %] 50 % Set Rate:  [22 bmp] 22 bmp Vt Set:  [500 mL] 500 mL PEEP:  [5 cmH20] 5 cmH20  INTAKE / OUTPUT:     PHYSICAL EXAMINATION: General: Obese AA male, intermittent myoclonic jerks. Neuro: Sedated, non-responsive.  HEENT: [place]/AT. PERRL, sclerae anicteric. Cardiovascular: RRR, no M/R/G.  Lungs: Respirations shallow and unlabored.  CTA bilaterally, No W/R/R. Abdomen: Obese, BS x 4, soft, NT/ND.  Musculoskeletal: No gross deformities, no edema.  Skin: Intact, warm, no rashes.  LABS:  BMET  Recent Labs Lab 02/14/15 2211  NA 138  K 3.4*  CL 104  CO2 23  BUN 23*  CREATININE 2.11*  GLUCOSE 201*    Electrolytes  Recent Labs Lab 02/14/15 2211  CALCIUM 8.9    CBC  Recent Labs Lab 02/14/15 2211  WBC 16.5*  HGB 15.4  HCT 49.8  PLT 224    Coag's  Recent Labs Lab 02/14/15 2211  APTT 32  INR 1.18    Sepsis Markers No results for input(s): LATICACIDVEN, PROCALCITON, O2SATVEN in the last 168 hours.  ABG  Recent Labs Lab 02/14/15 2301  PHART 7.25*  PCO2ART 48  PO2ART 231*    Liver Enzymes  Recent Labs Lab 02/14/15 2211  AST 99*  ALT 104*  ALKPHOS 107  BILITOT 0.9  ALBUMIN 3.6    Cardiac Enzymes  Recent Labs Lab 02/14/15 2211  TROPONINI  0.07*    Glucose No results for input(s): GLUCAP in the last 168 hours.  Imaging Ct Head Wo Contrast  02/17/2015  ADDENDUM REPORT: February 18, 2015 00:01 IMPRESSION: 1. 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 2. No acute intracranial abnormalities. 3. Cervical spondylosis without evidence for fracture or subluxation. Electronically Signed   By: Signa Kell M.D.   On: February 18, 2015 00:01  02/16/2015  CLINICAL DATA:  Cardiac arrest.  Witnessed fall. EXAM: CT HEAD WITHOUT CONTRAST CT CERVICAL SPINE WITHOUT CONTRAST TECHNIQUE: Multidetector CT imaging of the head and cervical spine was performed following the standard protocol without intravenous contrast. Multiplanar CT image reconstructions of the cervical spine were also generated. COMPARISON:  2/ 6/14 FINDINGS: CT HEAD FINDINGS Chronic right occipital infarct and left posterior parietal infarcts identified. Ventricular volumes and sulci appear normal. No abnormal extra-axial fluid collection, intracranial hemorrhage or mass identified. No acute brain infarct identified. The paranasal sinuses and the mastoid air cells are clear. The calvarium is intact. CT CERVICAL SPINE FINDINGS Normal alignment of the cervical spine. The vertebral body heights are well preserved. There is multi level disc space narrowing and ventral endplate spurring compatible with degenerative disc disease. The facet joints are well-aligned. No fractures or dislocations identified. IMPRESSION: 1. 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 2. No acute intracranial abnormalities. 3. Cervical spondylosis blow-out evidence for fracture or subluxation. Electronically Signed: By: Signa Kell M.D. On: 02/14/2015 23:29   Ct Cervical Spine Wo Contrast  03/08/2015  ADDENDUM REPORT: February 18, 2015 00:01 IMPRESSION: 1. 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 2. No acute intracranial abnormalities. 3. Cervical spondylosis without evidence for  fracture or subluxation. Electronically Signed   By: Signa Kell M.D.   On: 03/01/2015 00:01  18-Feb-2015  CLINICAL DATA:  Cardiac arrest.  Witnessed fall. EXAM: CT HEAD WITHOUT CONTRAST CT CERVICAL SPINE WITHOUT CONTRAST TECHNIQUE: Multidetector CT imaging of the head and cervical spine was performed following the standard protocol without intravenous contrast. Multiplanar CT image reconstructions of the cervical spine were also generated. COMPARISON:  2/ 6/14 FINDINGS: CT HEAD FINDINGS Chronic right occipital infarct and left posterior parietal infarcts identified. Ventricular volumes and sulci appear normal. No abnormal extra-axial fluid collection, intracranial hemorrhage or mass identified. No acute brain infarct identified. The paranasal sinuses and the mastoid air cells are clear. The calvarium is intact. CT CERVICAL SPINE FINDINGS Normal alignment of the cervical spine. The vertebral body heights are well preserved. There is multi level disc space narrowing and ventral endplate spurring compatible with degenerative disc disease. The facet joints are well-aligned. No fractures or dislocations identified. IMPRESSION: 1. 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 2. No acute intracranial abnormalities. 3. Cervical spondylosis blow-out evidence  for fracture or subluxation. Electronically Signed: By: Signa Kell M.D. On: 02/14/2015 23:29   Dg Chest Portable 1 View  02/14/2015  CLINICAL DATA:  Intubation.  Code. EXAM: PORTABLE CHEST 1 VIEW COMPARISON:  10/30/2014 FINDINGS: Chronic cardiopericardial enlargement. Unchanged aortic and hilar contours when allowing for differences in technique. Endotracheal tube with tip between the clavicular heads and carina. There is an orogastric tube which reaches the stomach at least. The orogastric tube is tortuous, likely from left atrial enlargement. Pulmonary venous congestion with cephalized blood flow, at least partially related to supine  positioning. No overt edema, effusion, or pneumothorax. Rounded lucency over the right chest wall is likely artifactual. No convincing subcutaneous gas. No visible fracture. IMPRESSION: 1. Located endotracheal and orogastric tubes. 2. Chronic cardiomegaly with venous congestion. Electronically Signed   By: Marnee Spring M.D.   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  No acute process. CXR 02/02 > cardiomegaly with vascular congestion.  CULTURES: Blood 02/02 > Urine 02/02 > Sputum 02/02 >  ANTIBIOTICS: None.  SIGNIFICANT EVENTS: 02/02 > admitted following cardiac arrest.  LINES/TUBES: ETT 02/02 > CVL pending 02/02 > A line pending 02/02 >  ASSESSMENT / PLAN:  CARDIOVASCULAR A:  S/p cardiac arrest - roughly 30 - 35  minutes ACLS prior to ROSC. Hx combined heart failure - Echo from Oct 2016 with LVEF 18%, grade 1 DD. P:  Initiate hypothermia protocol. Goal MAP > 80 during hypothermia. Echo. Trend troponins / lactate. Consult cardiology. Continue outpatient amiodarone. Hold outpatient carvedilol, digoxin, furosemide, entresto.  NEUROLOGIC A:   Acute metabolic encephalopathy with concern for anoxic brain injury. ? Seizure disorder - reportedly had seizure at home prior to arrest. P:   Sedation:  Cisatracurium gttt / Propofol gtt / Fentanyl gtt. RASS goal: - 5 during hypothermia protocol. Hold WUA until off paralytics. Start Keppra. EEG. Neuro consult. Assess UDS.  PULMONARY A: VDRF due to inability to protect airway - secondary to cardiac arrest. Mild pulmonary edema. OSA / probable OHS. P:   Full vent support. ABG and adjust vent accordingly. Hold SBT until rewarmed. Albuterol PRN. CXR in AM.  RENAL A:   Hypokalemia. AKI. P:   NS @ 100. Correct electrolytes as indicated. Frequent BMP's.  GASTROINTESTINAL A:   GI prophylaxis. Nutrition. P:   SUP:  pantoprazole. NPO.  HEMATOLOGIC A:   VTE prophylaxis. P:  SCD's / Heparin. Coags q8 x 2. CBC in AM.  INFECTIOUS A:   No evidence of infection. P:   Monitor clinically. Follow cultures. Assess PCT.  ENDOCRINE A:   Hyperglycemia - no hx DM. P:   ICU hyperglycemia protocol. Assess Hgb A1c.   Family updated: Wife and sister updated.  Interdisciplinary Family Meeting v Palliative Care Meeting:  Due by: 02/21/15.  CC time:  45 minutes.   Rutherford Guys, [place] [facility] [place] [facility] Pager: [phone]  or [phone] 03/03/15, 12:25 AM

## 2015-02-15 NOTE — Procedures (Signed)
Central Venous Catheter Insertion Procedure Note JHAMAL POLMANTEER [id number] 1965/05/27  Procedure: Insertion of Central Venous Catheter Indications: Assessment of intravascular volume, Drug and/or fluid administration and Frequent blood sampling  Procedure Details Consent: Risks of procedure as well as the alternatives and risks of each were explained to the (patient/caregiver).  Consent for procedure obtained. Time Out: Verified patient identification, verified procedure, site/side was marked, verified correct patient position, special equipment/implants available, medications/allergies/relevent history reviewed, required imaging and test results available.  Performed  Maximum sterile technique was used including antiseptics, cap, gloves, gown, hand hygiene, mask and sheet. Skin prep: Chlorhexidine; local anesthetic administered A antimicrobial bonded/coated triple lumen catheter was placed in the right femoral vein due to multiple attempts, no other available access using the Seldinger technique.  Evaluation Blood flow good Complications: No apparent complications Patient did tolerate procedure well. Chest X-ray ordered to verify placement.  CXR: pending.  Procedure performed under direct ultrasound guidance for real time vessel cannulation.      Rutherford Guys, [place] [facility] [place] [facility] Pager: [phone]  or [phone] 03/07/15, 2:00 AM

## 2015-02-15 NOTE — Progress Notes (Signed)
Notified Elink MD of patient's continue increased blood pressure. Patient blood pressure is currently not supported by any pressor medications. Will continue to monitor patient. Milon Dikes, RN

## 2015-02-15 NOTE — Consult Note (Signed)
Referring Physician: Dr Lavinia Sharps   Primary Physician: Primary Cardiologist: Dr. Laurier Nancy Reason for Consultation: cardiac arrest   HPI: Pt is unresponsive and mechanically ventilated; therefore, this HPI is obtained from chart review. Corderro Koloski is a 50 y.o. 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 He had recent admission to [facility] 10/30/14 through 11/01/14 for AoC systolic and diastolic CHF. He had medications adjusted and apparently given a LifeVest but do not see any documentation beyond this.    On 02/02, he was taken to [facility] with cardiac arrest. He was using restroom at home and wife heard a loud noise. When she went to restroom, she found him on the floor in between the commode and the tub. He was apparently unresponsive and shaking as if he were having a seizure. She was unable to arouse him; therefore, she called EMS. Firefighters were first to scene and on their arrival, pt reportedly did have a pulse. While they were there, he lost pulses and had VT / VF arrest. He received 1 shock and 4 epi's prior to ROSC. Total time of ACLS was roughly 30 minutes.  In ED, he was intubated for airway protection and was then transferred to [facility] for further evaluation and management.   Review of Systems: could not be obtained    Cardiac Review of Systems: {Y] = yes [ ]  = no  Chest Pain [    ]  Resting SOB [   ] Exertional SOB  [  ]  Orthopnea [  ]   Pedal Edema [   ]    Palpitations [  ] Syncope  [  ]   Presyncope [   ]  General Review of Systems: [Y] = yes [  ]=no Constitional: recent weight change [  ]; anorexia [  ]; fatigue [  ]; nausea [  ]; night sweats [  ]; fever [  ]; or chills [  ];                                                                      Eyes : blurred vision [  ]; diplopia [   ]; vision changes [  ];  Amaurosis fugax[   ]; Resp: cough [  ];  wheezing[  ];  hemoptysis[  ];  PND [  ];  GI:  gallstones[  ], vomiting[  ];  dysphagia[  ]; melena[  ];  hematochezia [  ]; heartburn[  ];   GU: kidney stones [  ]; hematuria[  ];   dysuria [  ];  nocturia[  ]; incontinence [  ];             Skin: rash, swelling[  ];, hair loss[  ];  peripheral edema[  ];  or itching[  ]; Musculosketetal: myalgias[  ];  joint swelling[  ];  joint erythema[  ];  joint pain[  ];  back pain[  ];  Heme/Lymph: bruising[  ];  bleeding[  ];  anemia[  ];  Neuro: TIA[  ];  headaches[  ];  stroke[  ];  vertigo[  ];  seizures[  ];   paresthesias[  ];  difficulty walking[  ];  Psych:depression[  ]; anxiety[  ];  Endocrine: diabetes[  ];  thyroid dysfunction[  ];  Other:  Past Medical History  Diagnosis Date  . CHF (congestive heart failure) (HCC)     Medications Prior to Admission  Medication Sig Dispense Refill  . allopurinol (ZYLOPRIM) 300 MG tablet Take 1 tablet (300 mg total) by mouth daily. 30 tablet 12  . amiodarone (PACERONE) 200 MG tablet Take 1 tablet (200 mg total) by mouth daily. 30 tablet 5  . carvedilol (COREG) 6.25 MG tablet Take 1 tablet (6.25 mg total) by mouth 2 (two) times daily with a meal. 60 tablet 6  . digoxin (LANOXIN) 0.125 MG tablet Take 0.125 mg by mouth daily.    . furosemide (LASIX) 40 MG tablet Take 1 tablet (40 mg total) by mouth daily. 30 tablet 6  . sacubitril-valsartan (ENTRESTO) 24-26 MG Take 1 tablet by mouth 2 (two) times daily. 60 tablet 6     . sodium chloride  2,000 mL Intravenous Once  . amiodarone  200 mg Per Tube Daily  . antiseptic oral rinse  7 mL Mouth Rinse 10 times per day  . artificial tears  1 application Both Eyes 3 times per day  . aspirin  300 mg Rectal NOW  . chlorhexidine gluconate  15 mL Mouth Rinse BID  . cisatracurium  0.1 mg/kg Intravenous Once  . fentaNYL (SUBLIMAZE) injection  100 mcg Intravenous Once  . heparin  5,000 Units Subcutaneous 3 times per day  . insulin aspart   2-6 Units Subcutaneous 6 times per day  . levETIRAcetam  1,000 mg Intravenous Once  . [START ON 02/16/2015] levETIRAcetam  500 mg Intravenous Q12H  . pantoprazole (PROTONIX) IV  40 mg Intravenous QHS    Infusions: . cisatracurium (NIMBEX) infusion    . fentaNYL infusion INTRAVENOUS    . midazolam (VERSED) infusion    . norepinephrine (LEVOPHED) Adult infusion      Allergies  Allergen Reactions  . Bee Venom Swelling  . Ace Inhibitors     Social History   Social History  . Marital Status: Married    Spouse Name: N/A  . Number of Children: N/A  . Years of Education: N/A   Occupational History  . Not on file.   Social History Main Topics  . Smoking status: Never Smoker   . Smokeless tobacco: Not on file  . Alcohol Use: No  . Drug Use: No  . Sexual Activity: Yes   Other Topics Concern  . Not on file   Social History Narrative    No family history on file.  PHYSICAL EXAM: Filed Vitals:   2015/02/27 0053 Feb 27, 2015 0100  BP: 126/109   Pulse: 67 132  Temp: 97.2 F (36.2 C) 97.2 F (36.2 C)  Resp: 26 27    No intake or output data in the 24 hours ending 2015/02/27 0223  General:  Obese, mechanically ventilated HEENT: atraumatic, PERRL, anicteric, ETT in place Neck: [place] J collar in place Cor: PMI difficult to pinpoint. Regular rate & rhythm. No rubs, gallops or murmurs. Lungs: clear vent sounds bilaterally Abdomen: obese, soft, nontender, nondistended. No hepatosplenomegaly. No bruits or masses. Bowel sounds present. Extremities: no cyanosis, clubbing, rash, edema Neuro: unresponsive  ECG: sinus with PVC and LBBB  Results for orders placed or performed during the hospital encounter of 02/27/2015 (from the past 24 hour(s))  I-STAT 3, arterial  blood gas (G3+)     Status: Abnormal   Collection Time: 2015-03-12  2:09 AM  Result Value Ref Range   pH, Arterial 7.278 (L) 7.350 - 7.450   pCO2 arterial 48.5 (H) 35.0 - 45.0 mmHg   pO2, Arterial 159.0 (H) 80.0 - 100.0 mmHg    Bicarbonate 22.7 20.0 - 24.0 mEq/L   TCO2 24 0 - 100 mmol/L   O2 Saturation 99.0 %   Acid-base deficit 5.0 (H) 0.0 - 2.0 mmol/L   Patient temperature 98.4 F    Collection site ARTERIAL LINE    Drawn by Operator    Sample type ARTERIAL    Ct Head Wo Contrast  2015/03/12  ADDENDUM REPORT: 03-12-15 00:01 IMPRESSION: 1. 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 2. No acute intracranial abnormalities. 3. Cervical spondylosis without evidence for fracture or subluxation. Electronically Signed   By: Signa Kell M.D.   On: 2015-03-12 00:01  March 12, 2015  CLINICAL DATA:  Cardiac arrest.  Witnessed fall. EXAM: CT HEAD WITHOUT CONTRAST CT CERVICAL SPINE WITHOUT CONTRAST TECHNIQUE: Multidetector CT imaging of the head and cervical spine was performed following the standard protocol without intravenous contrast. Multiplanar CT image reconstructions of the cervical spine were also generated. COMPARISON:  2/ 6/14 FINDINGS: CT HEAD FINDINGS Chronic right occipital infarct and left posterior parietal infarcts identified. Ventricular volumes and sulci appear normal. No abnormal extra-axial fluid collection, intracranial hemorrhage or mass identified. No acute brain infarct identified. The paranasal sinuses and the mastoid air cells are clear. The calvarium is intact. CT CERVICAL SPINE FINDINGS Normal alignment of the cervical spine. The vertebral body heights are well preserved. There is multi level disc space narrowing and ventral endplate spurring compatible with degenerative disc disease. The facet joints are well-aligned. No fractures or dislocations identified. IMPRESSION: 1. 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 2. No acute intracranial abnormalities. 3. Cervical spondylosis blow-out evidence for fracture or subluxation. Electronically Signed: By: Signa Kell M.D. On: 02/14/2015 23:29   Ct Cervical Spine Wo Contrast  03/12/2015  ADDENDUM REPORT: 03-12-2015 00:01  IMPRESSION: 1. 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 2. No acute intracranial abnormalities. 3. Cervical spondylosis without evidence for fracture or subluxation. Electronically Signed   By: Signa Kell M.D.   On: March 12, 2015 00:01  03-12-15  CLINICAL DATA:  Cardiac arrest.  Witnessed fall. EXAM: CT HEAD WITHOUT CONTRAST CT CERVICAL SPINE WITHOUT CONTRAST TECHNIQUE: Multidetector CT imaging of the head and cervical spine was performed following the standard protocol without intravenous contrast. Multiplanar CT image reconstructions of the cervical spine were also generated. COMPARISON:  2/ 6/14 FINDINGS: CT HEAD FINDINGS Chronic right occipital infarct and left posterior parietal infarcts identified. Ventricular volumes and sulci appear normal. No abnormal extra-axial fluid collection, intracranial hemorrhage or mass identified. No acute brain infarct identified. The paranasal sinuses and the mastoid air cells are clear. The calvarium is intact. CT CERVICAL SPINE FINDINGS Normal alignment of the cervical spine. The vertebral body heights are well preserved. There is multi level disc space narrowing and ventral endplate spurring compatible with degenerative disc disease. The facet joints are well-aligned. No fractures or dislocations identified. IMPRESSION: 1. 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 2. No acute intracranial abnormalities. 3. Cervical spondylosis blow-out evidence for fracture or subluxation. Electronically Signed: By: Signa Kell M.D. On: 02/14/2015 23:29   Dg Chest Portable 1 View  02/14/2015  CLINICAL DATA:  Intubation.  Code. EXAM: PORTABLE CHEST 1 VIEW COMPARISON:  10/30/2014  FINDINGS: Chronic cardiopericardial enlargement. Unchanged aortic and hilar contours when allowing for differences in technique. Endotracheal tube with tip between the clavicular heads and carina. There is an orogastric tube which reaches the stomach at least. The  orogastric tube is tortuous, likely from left atrial enlargement. Pulmonary venous congestion with cephalized blood flow, at least partially related to supine positioning. No overt edema, effusion, or pneumothorax. Rounded lucency over the right chest wall is likely artifactual. No convincing subcutaneous gas. No visible fracture. IMPRESSION: 1. Located endotracheal and orogastric tubes. 2. Chronic cardiomegaly with venous congestion. Electronically Signed   By: Marnee Spring M.D.   On: 02/14/2015 22:55     ASSESSMENT: 50 y.o. M with PMH of NICM likely 2/2 hypertensive heart disease, combined CHF (LVEF 20-25%), paroxysmal AF, HTN and OSA who presents after cardiac arrest at home with ~ 30 min ACLS prior to ROSC.  Cannot say with certainty inciting event was cardiac arrhythmia as some notes mention seizure like activity and initial pulse on arrival of firefighters.  However, he clearly has underlying substrate for ventricular dysrhythmias in setting of NCIM with severely reduced EF.    PLAN/DISCUSSION: Supportive care per CCM Agree with hypothermia Continue home PO amiodarone Restart home coreg and entresto once BP and Cr/K confirmed stable If he has meaningful recover, will need EP consult for ICD

## 2015-02-15 NOTE — Procedures (Signed)
Arterial Catheter Insertion Procedure Note KAYNE COULTAS [id number] 1965-12-10  Procedure: Insertion of Arterial Catheter  Indications: Blood pressure monitoring and Frequent blood sampling  Procedure Details Consent: Risks of procedure as well as the alternatives and risks of each were explained to the (patient/caregiver).  Consent for procedure obtained. Time Out: Verified patient identification, verified procedure, site/side was marked, verified correct patient position, special equipment/implants available, medications/allergies/relevent history reviewed, required imaging and test results available.  Performed  Maximum sterile technique was used including antiseptics, cap, gloves, gown, hand hygiene, mask and sheet. Skin prep: Chlorhexidine; local anesthetic administered 20 gauge catheter was inserted into right femoral artery using the Seldinger technique.  Evaluation Blood flow good; BP tracing good. Complications: No apparent complications.  Procedure performed under direct ultrasound guidance for real time vessel cannulation.      Rutherford Guys, [place] [facility] Pager: [phone]  or [phone] 03/07/2015, 2:01 AM

## 2015-02-15 NOTE — Progress Notes (Signed)
PULMONARY / CRITICAL CARE MEDICINE   Name: Gregory Chase MRN: [id number] DOB: 03-Jul-1965    ADMISSION DATE:  02/25/2015 CONSULTATION DATE:  02/13/2015  REFERRING MD:  [facility] ED  CHIEF COMPLAINT:  Cardiac Arrest.  HISTORY OF PRESENT ILLNESS:  Pt is encephelopathic; therefore, this HPI is obtained from chart review. Gregory Chase is a 50 y.o. 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  He had recent admission to [facility] 10/30/14 through 11/01/14 for AoC systolic and diastolic CHF.  On 02/02, he was brought to [facility] with cardiac arrest.  He was using restroom at home and wife heard a loud noise.  When she went to restroom, she found him on the floor in between the commode and the tub.  He was apparently unresponsive and shaking as if he were seizing.  She was unable to arouse him; therefore, she called EMS.  Firefighters were first to scene and on their arrival, pt reportedly did have a pulse.  While they were there, he lost pulses and had VT / VF arrest.  He received 1 shock and 4 epi's prior to ROSC.  Total time of ACLS was roughly 30 minutes.  In ED, he was intubated for airway protection and was then transferred to [facility] for further evaluation and management.  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  No acute process. CXR 02/02: cardiomegaly with vascular congestion. Port CXR 2/3: ETT 2.6cm above carina. No focal consolidation. EEG 2/3:  Pending  MICROBIOLOGY: Blood 02/03 > Urine 02/03 > Sputum 02/03 > MRSA PCR 2/3:  Negative  ANTIBIOTICS: None.  SIGNIFICANT EVENTS: 02/02 - admitted following cardiac arrest.  LINES/TUBES: OETT 8.0 02/02 > R Fem CVL 02/03 > R Fem A Line 02/03 > OGT 02/02 > Foley 02/02 > PIV x3  SUBJECTIVE: Remains on ventilator. Cooling per protocol.  REVIEW OF SYSTEMS:  Unable to obtain as patient intubated & sedated.  VITAL SIGNS: BP 125/104 mmHg  Pulse 34   Temp(Src) 92.1 F (33.4 C) (Core (Comment))  Resp 14  Ht 6\' 1"  (1.854 m)  Wt 289 lb 14.5 oz (131.5 kg)  BMI 38.26 kg/m2  SpO2 100%  HEMODYNAMICS: CVP:  [12 mmHg-13 mmHg] 12 mmHg  VENTILATOR SETTINGS: Vent Mode:  [-] PRVC FiO2 (%):  [50 %-100 %] 50 % Set Rate:  [14 bmp-22 bmp] 14 bmp Vt Set:  [500 mL-620 mL] 620 mL PEEP:  [5 cmH20] 5 cmH20 Plateau Pressure:  [20 cmH20-24 cmH20] 20 cmH20  INTAKE / OUTPUT: I/O last 3 completed shifts: In: 428.3 [I.V.:168.3; IV Piggyback:260] Out: 1600 [Urine:1600]   PHYSICAL EXAMINATION: General: Obese AA male, intermittent myoclonic jerks. Neuro: Sedated, non-responsive.  HEENT: [place]/AT. PERRL, sclerae anicteric. Cardiovascular: RRR, no M/R/G.  Lungs: Respirations shallow and unlabored.  CTA bilaterally, No W/R/R. Abdomen: Obese, BS x 4, soft, NT/ND.  Musculoskeletal: No gross deformities, no edema.  Skin: Intact, warm, no rashes.  LABS:  BMET  Recent Labs Lab 02/14/15 2211  03/11/2015 0301 02/25/2015 0432 03/03/2015 0644  NA 138  < > 141 144 143  K 3.4*  < > 3.2* 2.5* 2.9*  CL 104  < > 106 105 103  CO2 23  --  22  --   --   BUN 23*  < > 25* 29* 28*  CREATININE 2.11*  < > 2.20* 1.90* 1.80*  GLUCOSE 201*  < > 109* 138* 157*  < > = values  in this interval not displayed.  Electrolytes  Recent Labs Lab 02/14/15 2211 02-24-2015 0301  CALCIUM 8.9 8.9    CBC  Recent Labs Lab 02/14/15 2211 02/24/15 0244 Feb 24, 2015 0432 02/24/15 0644  WBC 16.5*  --   --   --   HGB 15.4 17.7* 17.0 18.0*  HCT 49.8 52.0 50.0 53.0*  PLT 224  --   --   --     Coag's  Recent Labs Lab 02/14/15 2211 02/24/2015 0301 02/24/2015 0402  APTT 32  --  25  INR 1.18 1.20 1.21    Sepsis Markers  Recent Labs Lab 02/14/15 2307 02/24/2015 0301  LATICACIDVEN 3.1*  --   PROCALCITON  --  1.76    ABG  Recent Labs Lab 02/14/15 2301 02/24/15 0209  PHART 7.25* 7.278*  PCO2ART 48 48.5*  PO2ART 231* 159.0*    Liver Enzymes  Recent Labs Lab  02/14/15 2211  AST 99*  ALT 104*  ALKPHOS 107  BILITOT 0.9  ALBUMIN 3.6    Cardiac Enzymes  Recent Labs Lab 02/14/15 2211 24-Feb-2015 0301  TROPONINI 0.07* 0.36*    Glucose  Recent Labs Lab 02-24-2015 0240 Feb 24, 2015 0242 02/24/15 0330 02/24/15 0434 24-Feb-2015 0537 02/24/15 0627  GLUCAP 101* 100* 132* 131* 89 138*    Imaging Ct Head Wo Contrast  02/24/15  ADDENDUM REPORT: 02-24-2015 00:01 IMPRESSION: 1. 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 2. No acute intracranial abnormalities. 3. Cervical spondylosis without evidence for fracture or subluxation. Electronically Signed   By: Signa Kell M.D.   On: 02/24/15 00:01  02-24-2015  CLINICAL DATA:  Cardiac arrest.  Witnessed fall. EXAM: CT HEAD WITHOUT CONTRAST CT CERVICAL SPINE WITHOUT CONTRAST TECHNIQUE: Multidetector CT imaging of the head and cervical spine was performed following the standard protocol without intravenous contrast. Multiplanar CT image reconstructions of the cervical spine were also generated. COMPARISON:  2/ 6/14 FINDINGS: CT HEAD FINDINGS Chronic right occipital infarct and left posterior parietal infarcts identified. Ventricular volumes and sulci appear normal. No abnormal extra-axial fluid collection, intracranial hemorrhage or mass identified. No acute brain infarct identified. The paranasal sinuses and the mastoid air cells are clear. The calvarium is intact. CT CERVICAL SPINE FINDINGS Normal alignment of the cervical spine. The vertebral body heights are well preserved. There is multi level disc space narrowing and ventral endplate spurring compatible with degenerative disc disease. The facet joints are well-aligned. No fractures or dislocations identified. IMPRESSION: 1. 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 2. No acute intracranial abnormalities. 3. Cervical spondylosis blow-out evidence for fracture or subluxation. Electronically Signed: By: Signa Kell M.D. On:  02/14/2015 23:29   Ct Cervical Spine Wo Contrast  2015/02/24  ADDENDUM REPORT: 02-24-2015 00:01 IMPRESSION: 1. 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 2. No acute intracranial abnormalities. 3. Cervical spondylosis without evidence for fracture or subluxation. Electronically Signed   By: Signa Kell M.D.   On: 2015/02/24 00:01  2015-02-24  CLINICAL DATA:  Cardiac arrest.  Witnessed fall. EXAM: CT HEAD WITHOUT CONTRAST CT CERVICAL SPINE WITHOUT CONTRAST TECHNIQUE: Multidetector CT imaging of the head and cervical spine was performed following the standard protocol without intravenous contrast. Multiplanar CT image reconstructions of the cervical spine were also generated. COMPARISON:  2/ 6/14 FINDINGS: CT HEAD FINDINGS Chronic right occipital infarct and left posterior parietal infarcts identified. Ventricular volumes and sulci appear normal. No abnormal extra-axial fluid collection, intracranial hemorrhage or mass identified. No acute brain infarct identified. The paranasal sinuses and the  mastoid air cells are clear. The calvarium is intact. CT CERVICAL SPINE FINDINGS Normal alignment of the cervical spine. The vertebral body heights are well preserved. There is multi level disc space narrowing and ventral endplate spurring compatible with degenerative disc disease. The facet joints are well-aligned. No fractures or dislocations identified. IMPRESSION: 1. 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 2. No acute intracranial abnormalities. 3. Cervical spondylosis blow-out evidence for fracture or subluxation. Electronically Signed: By: Signa Kell M.D. On: 02/14/2015 23:29   Dg Chest Port 1 View  03/15/15  CLINICAL DATA:  Central line placement. EXAM: PORTABLE CHEST 1 VIEW COMPARISON:  Yesterday at 2220 hour FINDINGS: Endotracheal tube 2.6 cm from the carina. Enteric tube is seen, at least to the level of the stomach. No central line. Review of electronic records  demonstrates a right femoral line was placed. Lung volumes are low. Cardiomegaly and vascular congestion are unchanged. No pneumothorax. No large pleural effusion or developing airspace disease. IMPRESSION: 1. No central line visible in the thorax. Correlation with site of central line placement recommended. 2. Endotracheal and enteric tubes in place. 3. Low lung volumes with stable cardiomegaly. Electronically Signed   By: Rubye Oaks M.D.   On: 03/15/15 02:22   Dg Chest Portable 1 View  02/14/2015  CLINICAL DATA:  Intubation.  Code. EXAM: PORTABLE CHEST 1 VIEW COMPARISON:  10/30/2014 FINDINGS: Chronic cardiopericardial enlargement. Unchanged aortic and hilar contours when allowing for differences in technique. Endotracheal tube with tip between the clavicular heads and carina. There is an orogastric tube which reaches the stomach at least. The orogastric tube is tortuous, likely from left atrial enlargement. Pulmonary venous congestion with cephalized blood flow, at least partially related to supine positioning. No overt edema, effusion, or pneumothorax. Rounded lucency over the right chest wall is likely artifactual. No convincing subcutaneous gas. No visible fracture. IMPRESSION: 1. Located endotracheal and orogastric tubes. 2. Chronic cardiomegaly with venous congestion. Electronically Signed   By: Marnee Spring M.D.   On: 02/14/2015 22:55    ASSESSMENT / PLAN:  CARDIOVASCULAR A:  S/p cardiac arrest - roughly 30 - 35  minutes ACLS prior to ROSC. Hx combined heart failure - Echo from Oct 2016 with LVEF 18%, grade 1 DD. P:  Initiate hypothermia protocol. Goal MAP > 80 during hypothermia. Echo. Trend troponins  Cardiology consulted Continue outpatient amiodarone. Hold outpatient carvedilol, digoxin, furosemide, entresto.  NEUROLOGIC A:   Acute metabolic encephalopathy with concern for anoxic brain injury. ? Seizure disorder - reportedly had seizure at home prior to arrest.  P:    Sedation:  Cisatracurium gttt / Propofol gtt / Fentanyl gtt. RASS goal: - 5 during hypothermia protocol. Hold WUA until off paralytics. Keppra IV bid EEG Pending Neuro consult. Awaiting UDS  PULMONARY A: VDRF due to inability to protect airway - secondary to cardiac arrest. Mild pulmonary edema. OSA / probable OHS.  P:   Full vent support. ABG and adjust vent accordingly. Hold SBT until rewarmed. Albuterol PRN. Port CXR tomorrow AM  RENAL A:   Hypokalemia - Replacing. Acute Renal Failure Lactic Acidosis - Mild.  P:   NS @ 100. Trending LA q6hr Correct electrolytes as indicated. Trending Renal Function per protocol Monitor UOP with Foley Trending electrolytes per protocol  GASTROINTESTINAL A:   No acute issues.  P:   Protonix IV daily NPO. Holding tube feedings until paralytic off  HEMATOLOGIC A:   No acute issues.  P:  SCD's Heparin [place] q8hr Coags q8 x  2. CBC in AM 2/4  INFECTIOUS A:   No acute issues.  P:   Ordering Blood, Urine, & Tracheal Aspirate cultures. Trending Procalcitonin per algorithm.  ENDOCRINE A:   Hyperglycemia - no h/o DM.  P:   ICU hyperglycemia protocol. Assess Hgb A1c.   Family updated: Wife and sister updated overnight by team.  Interdisciplinary Family Meeting v Palliative Care Meeting:  Due by: 02/21/15.   TODAY'S SUMMARY:  50 y.o. Male admitted overnight s/p arrest. Started on hypothermia protocol. Checking cultures now.   I have spent an additional 12 minutes of critical care time this morning caring for the patient and reviewing his electronic medical record.  Donna Christen Jamison Neighbor, M.D. [facility] Pager:  [phone] After 3pm or if no response, call [phone]  2015-03-12, 7:23 AM

## 2015-02-15 NOTE — Progress Notes (Signed)
Stat EEG completed at bedside, results pending.  MD aware.

## 2015-02-15 NOTE — Progress Notes (Signed)
PCCM Attending Note: Patient's wife and son updated at length at bedside regarding his critical status and current plan of care regarding ongoing cooling, paralysis, and sedation. At this time he is requiring low-dose of vasopressor infusion for hypotension. I cautioned the patient's wife that he may have some element of brain injury from his cardiac arrest and that would be difficult to ascertain exactly what extent until he is fully rewarmed. I offered hospital chaplain for spiritual support and bedside prayer. The patient's family agreed and I requested the unit secretary contact hospital chaplain.  I have spent an additional 14 minutes of critical care time today updating the patient's family at bedside regarding his critical status.  Donna Christen Jamison Neighbor, M.D. [facility] Pager:  [phone] After 3pm or if no response, call [phone]

## 2015-02-16 ENCOUNTER — Other Ambulatory Visit (HOSPITAL_COMMUNITY): Payer: Self-pay

## 2015-02-16 DIAGNOSIS — G9340 Encephalopathy, unspecified: Secondary | ICD-10-CM

## 2015-02-16 DIAGNOSIS — G931 Anoxic brain damage, not elsewhere classified: Secondary | ICD-10-CM | POA: Insufficient documentation

## 2015-02-16 DIAGNOSIS — N179 Acute kidney failure, unspecified: Secondary | ICD-10-CM

## 2015-02-16 LAB — GLUCOSE, CAPILLARY
GLUCOSE-CAPILLARY: 105 mg/dL — AB (ref 65–99)
GLUCOSE-CAPILLARY: 110 mg/dL — AB (ref 65–99)
GLUCOSE-CAPILLARY: 111 mg/dL — AB (ref 65–99)
GLUCOSE-CAPILLARY: 118 mg/dL — AB (ref 65–99)
GLUCOSE-CAPILLARY: 125 mg/dL — AB (ref 65–99)
GLUCOSE-CAPILLARY: 143 mg/dL — AB (ref 65–99)
Glucose-Capillary: 97 mg/dL (ref 65–99)
Glucose-Capillary: 112 mg/dL — ABNORMAL HIGH (ref 65–99)
Glucose-Capillary: 140 mg/dL — ABNORMAL HIGH (ref 65–99)

## 2015-02-16 LAB — BASIC METABOLIC PANEL
ANION GAP: 9 (ref 5–15)
Anion gap: 17 — ABNORMAL HIGH (ref 5–15)
Anion gap: 11 (ref 5–15)
Anion gap: 11 (ref 5–15)
BUN: 25 mg/dL — AB (ref 6–20)
BUN: 24 mg/dL — AB (ref 6–20)
BUN: 25 mg/dL — ABNORMAL HIGH (ref 6–20)
BUN: 25 mg/dL — ABNORMAL HIGH (ref 6–20)
CALCIUM: 8.9 mg/dL (ref 8.9–10.3)
CALCIUM: 8.6 mg/dL — AB (ref 8.9–10.3)
CHLORIDE: 105 mmol/L (ref 101–111)
CHLORIDE: 106 mmol/L (ref 101–111)
CHLORIDE: 108 mmol/L (ref 101–111)
CO2: 19 mmol/L — ABNORMAL LOW (ref 22–32)
CO2: 22 mmol/L (ref 22–32)
CO2: 22 mmol/L (ref 22–32)
CO2: 21 mmol/L — AB (ref 22–32)
CREATININE: 1.86 mg/dL — AB (ref 0.61–1.24)
Calcium: 8.4 mg/dL — ABNORMAL LOW (ref 8.9–10.3)
Calcium: 8.8 mg/dL — ABNORMAL LOW (ref 8.9–10.3)
Chloride: 108 mmol/L (ref 101–111)
Creatinine, Ser: 1.94 mg/dL — ABNORMAL HIGH (ref 0.61–1.24)
Creatinine, Ser: 2.04 mg/dL — ABNORMAL HIGH (ref 0.61–1.24)
Creatinine, Ser: 2.06 mg/dL — ABNORMAL HIGH (ref 0.61–1.24)
GFR calc Af Amer: 47 mL/min — ABNORMAL LOW (ref >60)
GFR calc Af Amer: 44 mL/min — ABNORMAL LOW (ref >60)
GFR calc non Af Amer: 40 mL/min — ABNORMAL LOW (ref >60)
GFR calc non Af Amer: 36 mL/min — ABNORMAL LOW (ref >60)
GFR calc non Af Amer: 36 mL/min — ABNORMAL LOW (ref >60)
GFR, EST AFRICAN AMERICAN: 42 mL/min — AB (ref >60)
GFR, EST AFRICAN AMERICAN: 41 mL/min — AB (ref >60)
GFR, EST NON AFRICAN AMERICAN: 38 mL/min — AB (ref >60)
GLUCOSE: 151 mg/dL — AB (ref 65–99)
Glucose, Bld: 139 mg/dL — ABNORMAL HIGH (ref 65–99)
Glucose, Bld: 132 mg/dL — ABNORMAL HIGH (ref 65–99)
Glucose, Bld: 129 mg/dL — ABNORMAL HIGH (ref 65–99)
POTASSIUM: 4 mmol/L (ref 3.5–5.1)
POTASSIUM: 3.8 mmol/L (ref 3.5–5.1)
POTASSIUM: 4 mmol/L (ref 3.5–5.1)
Potassium: 3.9 mmol/L (ref 3.5–5.1)
SODIUM: 141 mmol/L (ref 135–145)
SODIUM: 139 mmol/L (ref 135–145)
SODIUM: 140 mmol/L (ref 135–145)
Sodium: 139 mmol/L (ref 135–145)

## 2015-02-16 LAB — POCT I-STAT, CHEM 8
BUN: 26 mg/dL — ABNORMAL HIGH (ref 6–20)
BUN: 27 mg/dL — ABNORMAL HIGH (ref 6–20)
CREATININE: 1.6 mg/dL — AB (ref 0.61–1.24)
Calcium, Ion: 1.2 mmol/L (ref 1.12–1.23)
Calcium, Ion: 1.22 mmol/L (ref 1.12–1.23)
Chloride: 105 mmol/L (ref 101–111)
Chloride: 107 mmol/L (ref 101–111)
Creatinine, Ser: 1.7 mg/dL — ABNORMAL HIGH (ref 0.61–1.24)
Glucose, Bld: 143 mg/dL — ABNORMAL HIGH (ref 65–99)
Glucose, Bld: 151 mg/dL — ABNORMAL HIGH (ref 65–99)
HEMATOCRIT: 51 % (ref 39.0–52.0)
HEMATOCRIT: 52 % (ref 39.0–52.0)
HEMOGLOBIN: 17.3 g/dL — AB (ref 13.0–17.0)
HEMOGLOBIN: 17.7 g/dL — AB (ref 13.0–17.0)
POTASSIUM: 3.8 mmol/L (ref 3.5–5.1)
Potassium: 3.7 mmol/L (ref 3.5–5.1)
SODIUM: 140 mmol/L (ref 135–145)
SODIUM: 142 mmol/L (ref 135–145)
TCO2: 20 mmol/L (ref 0–100)
TCO2: 21 mmol/L (ref 0–100)

## 2015-02-16 LAB — URINE CULTURE
Culture: NO GROWTH
Special Requests: NORMAL

## 2015-02-16 LAB — CBC
HEMATOCRIT: 45.9 % (ref 39.0–52.0)
HEMOGLOBIN: 15.2 g/dL (ref 13.0–17.0)
MCH: 26.3 pg (ref 26.0–34.0)
MCHC: 33.1 g/dL (ref 30.0–36.0)
MCV: 79.5 fL (ref 78.0–100.0)
PLATELETS: 170 10*3/uL (ref 150–400)
RBC: 5.77 MIL/uL (ref 4.22–5.81)
RDW: 16.1 % — ABNORMAL HIGH (ref 11.5–15.5)
WBC: 13.9 10*3/uL — AB (ref 4.0–10.5)

## 2015-02-16 LAB — PROCALCITONIN: Procalcitonin: 3.69 ng/mL

## 2015-02-16 LAB — MAGNESIUM
MAGNESIUM: 1.5 mg/dL — AB (ref 1.7–2.4)
MAGNESIUM: 2.1 mg/dL (ref 1.7–2.4)

## 2015-02-16 MED ORDER — NOREPINEPHRINE BITARTRATE 1 MG/ML IV SOLN
0.0000 ug/min | INTRAVENOUS | Status: DC
  Administered 2015-02-16: 20 ug/min via INTRAVENOUS
  Administered 2015-02-17: 19 ug/min via INTRAVENOUS
  Filled 2015-02-16 (×3): qty 16

## 2015-02-16 MED ORDER — VALPROATE SODIUM 500 MG/5ML IV SOLN
1000.0000 mg | Freq: Once | INTRAVENOUS | Status: AC
  Administered 2015-02-16: 1000 mg via INTRAVENOUS
  Filled 2015-02-16: qty 10

## 2015-02-16 MED ORDER — MAGNESIUM SULFATE 2 GM/50ML IV SOLN
2.0000 g | Freq: Once | INTRAVENOUS | Status: AC
  Administered 2015-02-16: 2 g via INTRAVENOUS
  Filled 2015-02-16: qty 50

## 2015-02-16 MED ORDER — PROPOFOL 1000 MG/100ML IV EMUL
5.0000 ug/kg/min | INTRAVENOUS | Status: DC
Start: 2015-02-16 — End: 2015-02-17
  Administered 2015-02-16: 5 ug/kg/min via INTRAVENOUS
  Administered 2015-02-17: 15 ug/kg/min via INTRAVENOUS
  Filled 2015-02-16 (×2): qty 100

## 2015-02-16 NOTE — Progress Notes (Signed)
Nimbex turned off at 2003. Pt appears to be having myoclonic like activity beginning at 2040. Called CCM to make aware of change in pt status. Neurology also paged and MD Roseanne Reno ordered 1000mg  IVPB [place] stat. Family at bedside and aware of change in pt status. Will continue to monitor.

## 2015-02-16 NOTE — Progress Notes (Signed)
PULMONARY / CRITICAL CARE MEDICINE   Name: Gregory Chase MRN: [id number] DOB: 1966-01-02    ADMISSION DATE:  02-22-2015 CONSULTATION DATE:  02/22/15  REFERRING MD:  [facility] ED  CHIEF COMPLAINT:  Cardiac Arrest.  HISTORY OF PRESENT ILLNESS:  Pt is encephelopathic; therefore, this HPI is obtained from chart review. [name] is a 50 y.o. 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  He had recent admission to [facility] 10/30/14 through 11/01/14 for AoC systolic and diastolic CHF.  On 02/02, he was brought to [facility] with cardiac arrest.  He was using restroom at home and wife heard a loud noise.  When she went to restroom, she found him on the floor in between the commode and the tub.  He was apparently unresponsive and shaking as if he were seizing.  She was unable to arouse him; therefore, she called EMS.  Firefighters were first to scene and on their arrival, pt reportedly did have a pulse.  While they were there, he lost pulses and had VT / VF arrest.  He received 1 shock and 4 epi's prior to ROSC.  Total time of ACLS was roughly 30 minutes.  In ED, he was intubated for airway protection and was then transferred to [facility] for further evaluation and management.  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  No acute process. CXR 02/02: cardiomegaly with vascular congestion. Port CXR 2/3: ETT 2.6cm above carina. No focal consolidation. EEG 2/3:  Pending  MICROBIOLOGY: Blood 02/03 > Urine 02/03 > Sputum 02/03 > MRSA PCR 2/3:  Negative  ANTIBIOTICS: None.  SIGNIFICANT EVENTS: 02/02 - admitted following cardiac arrest.  LINES/TUBES: OETT 8.0 02/02 > R Fem CVL 02/03 > R Fem A Line 02/03 > OGT 02/02 > Foley 02/02 > PIV x3  SUBJECTIVE: Remains on ventilator. Cooling per protocol.  REVIEW OF SYSTEMS:  Unable to obtain as patient intubated & sedated.  VITAL SIGNS: BP 129/72 mmHg  Pulse 45   Temp(Src) 91.6 F (33.1 C) (Core (Comment))  Resp 14  Ht 6\' 1"  (1.854 m)  Wt 133 kg (293 lb 3.4 oz)  BMI 38.69 kg/m2  SpO2 99%  HEMODYNAMICS: CVP:  [5 mmHg-9 mmHg] 7 mmHg  VENTILATOR SETTINGS: Vent Mode:  [-] PRVC FiO2 (%):  [40 %-50 %] 40 % Set Rate:  [14 bmp] 14 bmp Vt Set:  [620 mL] 620 mL PEEP:  [5 cmH20] 5 cmH20 Plateau Pressure:  [19 cmH20-20 cmH20] 20 cmH20  INTAKE / OUTPUT: I/O last 3 completed shifts: In: 2487 [I.V.:1827; NG/GT:100; IV Piggyback:560] Out: 3215 [Urine:3215]  PHYSICAL EXAMINATION: General: Obese AA male, sedated and paralyzed. Neuro: Sedated, non-responsive.  HEENT: [place]/AT. PERRL, sclerae anicteric. Cardiovascular: RRR, no M/R/G.  Lungs: Respirations shallow and unlabored.  CTA bilaterally, No W/R/R. Abdomen: Obese, BS x 4, soft, NT/ND.  Musculoskeletal: No gross deformities, no edema.  Skin: Intact, warm, no rashes.  LABS:  BMET  Recent Labs Lab 02/14/15 2211  02/22/2015 0301  02/16/15 0013 02/16/15 0401 02/16/15 0830  NA 138  < > 141  < > 142 140 141  K 3.4*  < > 3.2*  < > 3.8 3.7 3.9  CL 104  < > 106  < > 105 107 105  CO2 23  --  22  --   --   --  19*  BUN 23*  < > 25*  < > 27* 26* 25*  CREATININE  2.11*  < > 2.20*  < > 1.70* 1.60* 1.86*  GLUCOSE 201*  < > 109*  < > 143* 151* 139*  < > = values in this interval not displayed.  Electrolytes  Recent Labs Lab 02/14/15 2211 02/17/2015 0301 02/16/15 0030 02/16/15 0400 02/16/15 0830  CALCIUM 8.9 8.9  --   --  8.9  MG  --   --  1.5* 2.1  --    CBC  Recent Labs Lab 02/14/15 2211  02/16/15 0013 02/16/15 0400 02/16/15 0401  WBC 16.5*  --   --  13.9*  --   HGB 15.4  < > 17.3* 15.2 17.7*  HCT 49.8  < > 51.0 45.9 52.0  PLT 224  --   --  170  --   < > = values in this interval not displayed.  Coag's  Recent Labs Lab 02/14/15 2211 18-Feb-2015 0301 February 18, 2015 0402 18-Feb-2015 1030  APTT 32  --  25 28  INR 1.18 1.20 1.21 1.18   Sepsis Markers  Recent Labs Lab 03/07/2015 0301  02/17/2015 1030 Feb 18, 2015 1445 02/23/2015 1956 02/16/15 0400  LATICACIDVEN  --  1.8 1.7 1.6  --   PROCALCITON 1.76  --   --   --  3.69   ABG  Recent Labs Lab 02/14/15 2301 18-Feb-2015 0209  PHART 7.25* 7.278*  PCO2ART 48 48.5*  PO2ART 231* 159.0*   Liver Enzymes  Recent Labs Lab 02/14/15 2211  AST 99*  ALT 104*  ALKPHOS 107  BILITOT 0.9  ALBUMIN 3.6   Cardiac Enzymes  Recent Labs Lab 02/21/2015 0640 02/28/2015 1530 03/06/2015 2030  TROPONINI 0.36* 0.14* 0.09*   Glucose  Recent Labs Lab 02/19/2015 1825 03/12/2015 1951 02/21/2015 2246 02/16/15 0010 02/16/15 0358 02/16/15 0812  GLUCAP 123* 122* 119* 118* 143* 105*   Imaging No results found.  ASSESSMENT / PLAN:  CARDIOVASCULAR A:  S/p cardiac arrest - roughly 30 - 35  minutes ACLS prior to ROSC. Hx combined heart failure - Echo from Oct 2016 with LVEF 18%, grade 1 DD. P:  Begin warming per hypothermia protocol. Goal MAP > 80 during hypothermia. Echo noted per cards. Trend troponins  Cardiology consult appreciated. Continue outpatient amiodarone. Hold outpatient carvedilol, digoxin, furosemide, entresto given hypotension.  NEUROLOGIC A:   Acute metabolic encephalopathy with concern for anoxic brain injury. ? Seizure disorder - reportedly had seizure at home prior to arrest.  P:   Sedation:  Cisatracurium gttt / Propofol gtt / Fentanyl gtt. RASS goal: - 5 during hypothermia protocol. Hold WUA until off paralytics. Keppra IV bid EEG per neuro. Neuro consult appreciated. UDS positive for benzos only but unclear when that was drawn in relation to drugs being used in patient.  PULMONARY A: VDRF due to inability to protect airway - secondary to cardiac arrest. Mild pulmonary edema. OSA / probable OHS.  P:   Full vent support. ABG and adjust vent accordingly. Hold SBT until rewarmed. Albuterol PRN. Port CXR tomorrow AM  RENAL A:   Hypokalemia - Replacing. Acute Renal Failure Lactic Acidosis -  Mild.  P:   NS @ 100. Correct electrolytes as indicated. Trending Renal Function per protocol Monitor UOP with Foley Trending electrolytes per protocol  GASTROINTESTINAL A:   No acute issues.  P:   Protonix IV daily NPO. Holding tube feedings until paralytic off  HEMATOLOGIC A:   No acute issues.  P:  SCD's Heparin [place] q8hr Coags q8 x 2. CBC in AM 2/4  INFECTIOUS A:  No acute issues.  P:   F/U on cultures. Trending Procalcitonin per algorithm.  ENDOCRINE A:   Hyperglycemia - no h/o DM.  P:   ICU hyperglycemia protocol. Assess Hgb A1c. Hold abx as ordered.  Family updated: No family bedside.  Interdisciplinary Family Meeting v Palliative Care Meeting:  Due by: 02/21/15.  The patient is critically ill with multiple organ systems failure and requires high complexity decision making for assessment and support, frequent evaluation and titration of therapies, application of advanced monitoring technologies and extensive interpretation of multiple databases.   Critical Care Time devoted to patient care services described in this note is  35  Minutes. This time reflects time of care of this signee Dr Koren Bound. This critical care time does not reflect procedure time, or teaching time or supervisory time of PA/NP/Med student/Med Resident etc but could involve care discussion time.  Alyson Reedy, M.D. [facility]. Pager: [phone]. After hours pager: [phone].

## 2015-02-16 NOTE — Progress Notes (Signed)
Subjective:  Intubated; paralyzed sedated on vent; beginning rewarming protocol.  T now at 34.1 C  Objective:   Vital Signs : Filed Vitals:   02/16/15 1000 02/16/15 1100 02/16/15 1128 02/16/15 1200  BP:   105/59   Pulse: 45 52 55 52  Temp: 91.8 F (33.2 C) 92.5 F (33.6 C)  93.2 F (34 C)  TempSrc: Core (Comment) Core (Comment)  Core (Comment)  Resp: '14 14 14 14  '$ Height:      Weight:      SpO2: 100% 100% 100% 100%    Intake/Output from previous day:  Intake/Output Summary (Last 24 hours) at 02/16/15 1236 Last data filed at 02/16/15 1215  Gross per 24 hour  Intake 2197.1 ml  Output    725 ml  Net 1472.1 ml    I/O since admission: -237  Wt Readings from Last 3 Encounters:  02/16/15 293 lb 3.4 oz (133 kg)  02/14/15 310 lb 13.6 oz (141 kg)  11/01/14 281 lb 6.4 oz (127.642 kg)    Medications: . sodium chloride  2,000 mL Intravenous Once  . amiodarone  200 mg Per Tube Daily  . antiseptic oral rinse  7 mL Mouth Rinse 10 times per day  . artificial tears  1 application Both Eyes 3 times per day  . chlorhexidine gluconate  15 mL Mouth Rinse BID  . fentaNYL (SUBLIMAZE) injection  100 mcg Intravenous Once  . heparin  5,000 Units Subcutaneous 3 times per day  . insulin aspart  2-6 Units Subcutaneous 6 times per day  . levETIRAcetam  500 mg Intravenous Q12H  . pantoprazole (PROTONIX) IV  40 mg Intravenous QHS  . sodium chloride flush  10-40 mL Intracatheter Q12H    . cisatracurium (NIMBEX) infusion 1.2 mcg/kg/min (03/10/2015 2305)  . fentaNYL infusion INTRAVENOUS 400 mcg/hr (02/16/15 0111)  . midazolam (VERSED) infusion 5 mg/hr (02/16/15 0539)  . norepinephrine (LEVOPHED) Adult infusion 18 mcg/min (02/16/15 1215)    Physical Exam:   General appearance: Intubated, sedated and paralyzed on vent; Obese Neck: no adenopathy, no carotid bruit, no JVD, supple, symmetrical, trachea midline and thyroid not enlarged, symmetric, no tenderness/mass/nodules Lungs: no  wheezing Heart: regular rate and rhythm Abdomen: soft, non-tender; bowel sounds normal; no masses,  no organomegaly Extremities: no edema, redness or tenderness in the calves or thighs Pulses: 2+ and symmetric Skin: warm; no rashes Neurologic: not able to assess.   Rate: 56-64  Rhythm: normal sinus rhythm  ECG (independently read by me): Sinus with PVC's at 96  Lab Results:   Recent Labs  02/14/15 2211  02/17/2015 0301  02/16/15 0013 02/16/15 0030 02/16/15 0400 02/16/15 0401 02/16/15 0830  NA 138  < > 141  < > 142  --   --  140 141  K 3.4*  < > 3.2*  < > 3.8  --   --  3.7 3.9  CL 104  < > 106  < > 105  --   --  107 105  CO2 23  --  22  --   --   --   --   --  19*  GLUCOSE 201*  < > 109*  < > 143*  --   --  151* 139*  BUN 23*  < > 25*  < > 27*  --   --  26* 25*  CREATININE 2.11*  < > 2.20*  < > 1.70*  --   --  1.60* 1.86*  CALCIUM 8.9  --  8.9  --   --   --   --   --  8.9  MG  --   --   --   --   --  1.5* 2.1  --   --   < > = values in this interval not displayed.  Hepatic Function Latest Ref Rng 02/14/2015 10/30/2014 03/16/2013  Total Protein 6.5 - 8.1 g/dL 6.8 6.8 6.6  Albumin 3.5 - 5.0 g/dL 3.6 3.7 3.2(L)  AST 15 - 41 U/L 99(H) 25 47(H)  ALT 17 - 63 U/L 104(H) 22 43  Alk Phosphatase 38 - 126 U/L 107 76 87  Total Bilirubin 0.3 - 1.2 mg/dL 0.9 1.0 1.1(H)     Recent Labs  02/14/15 2211  02/16/15 0013 02/16/15 0400 02/16/15 0401  WBC 16.5*  --   --  13.9*  --   NEUTROABS 5.8  --   --   --   --   HGB 15.4  < > 17.3* 15.2 17.7*  HCT 49.8  < > 51.0 45.9 52.0  MCV 82.7  --   --  79.5  --   PLT 224  --   --  170  --   < > = values in this interval not displayed.   Recent Labs  02/24/2015 0640 02/28/2015 1530 03/05/2015 2030  TROPONINI 0.36* 0.14* 0.09*    Lab Results  Component Value Date   TSH 1.198 02/21/2015   No results for input(s): HGBA1C in the last 72 hours.   Recent Labs  02/14/15 2211  PROT 6.8  ALBUMIN 3.6  AST 99*  ALT 104*  ALKPHOS 107   BILITOT 0.9    Recent Labs  02/23/2015 1030  INR 1.18   BNP (last 3 results)  Recent Labs  10/30/14 0631 02/14/15 2211  BNP 678.0* 323.0*    ProBNP (last 3 results) No results for input(s): PROBNP in the last 8760 hours.   Lipid Panel     Component Value Date/Time   CHOL 124 02/19/2012 0523   TRIG 139 02/14/2015 2303   TRIG 108 02/19/2012 0523   HDL 32* 02/19/2012 0523   VLDL 22 02/19/2012 0523   LDLCALC 70 02/19/2012 0523         Imaging:  Ct Head Wo Contrast  02/26/2015  ADDENDUM REPORT: 02/20/2015 00:01 IMPRESSION: 1. 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 2. No acute intracranial abnormalities. 3. Cervical spondylosis without evidence for fracture or subluxation. Electronically Signed   By: Kerby Moors M.D.   On: 02/22/2015 00:01  03/05/2015  CLINICAL DATA:  Cardiac arrest.  Witnessed fall. EXAM: CT HEAD WITHOUT CONTRAST CT CERVICAL SPINE WITHOUT CONTRAST TECHNIQUE: Multidetector CT imaging of the head and cervical spine was performed following the standard protocol without intravenous contrast. Multiplanar CT image reconstructions of the cervical spine were also generated. COMPARISON:  2/ 6/14 FINDINGS: CT HEAD FINDINGS Chronic right occipital infarct and left posterior parietal infarcts identified. Ventricular volumes and sulci appear normal. No abnormal extra-axial fluid collection, intracranial hemorrhage or mass identified. No acute brain infarct identified. The paranasal sinuses and the mastoid air cells are clear. The calvarium is intact. CT CERVICAL SPINE FINDINGS Normal alignment of the cervical spine. The vertebral body heights are well preserved. There is multi level disc space narrowing and ventral endplate spurring compatible with degenerative disc disease. The facet joints are well-aligned. No fractures or dislocations identified. IMPRESSION: 1. 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 2. No acute intracranial  abnormalities. 3.  Cervical spondylosis blow-out evidence for fracture or subluxation. Electronically Signed: By: Kerby Moors M.D. On: 02/14/2015 23:29   Ct Cervical Spine Wo Contrast  02/16/2015  ADDENDUM REPORT: 03/04/2015 00:01 IMPRESSION: 1. 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 2. No acute intracranial abnormalities. 3. Cervical spondylosis without evidence for fracture or subluxation. Electronically Signed   By: Kerby Moors M.D.   On: 02/17/2015 00:01  02/14/2015  CLINICAL DATA:  Cardiac arrest.  Witnessed fall. EXAM: CT HEAD WITHOUT CONTRAST CT CERVICAL SPINE WITHOUT CONTRAST TECHNIQUE: Multidetector CT imaging of the head and cervical spine was performed following the standard protocol without intravenous contrast. Multiplanar CT image reconstructions of the cervical spine were also generated. COMPARISON:  2/ 6/14 FINDINGS: CT HEAD FINDINGS Chronic right occipital infarct and left posterior parietal infarcts identified. Ventricular volumes and sulci appear normal. No abnormal extra-axial fluid collection, intracranial hemorrhage or mass identified. No acute brain infarct identified. The paranasal sinuses and the mastoid air cells are clear. The calvarium is intact. CT CERVICAL SPINE FINDINGS Normal alignment of the cervical spine. The vertebral body heights are well preserved. There is multi level disc space narrowing and ventral endplate spurring compatible with degenerative disc disease. The facet joints are well-aligned. No fractures or dislocations identified. IMPRESSION: 1. 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 2. No acute intracranial abnormalities. 3. Cervical spondylosis blow-out evidence for fracture or subluxation. Electronically Signed: By: Kerby Moors M.D. On: 02/14/2015 23:29   Dg Chest Port 1 View  03/04/2015  CLINICAL DATA:  Central line placement. EXAM: PORTABLE CHEST 1 VIEW COMPARISON:  Yesterday at 2220 hour FINDINGS: Endotracheal  tube 2.6 cm from the carina. Enteric tube is seen, at least to the level of the stomach. No central line. Review of electronic records demonstrates a right femoral line was placed. Lung volumes are low. Cardiomegaly and vascular congestion are unchanged. No pneumothorax. No large pleural effusion or developing airspace disease. IMPRESSION: 1. No central line visible in the thorax. Correlation with site of central line placement recommended. 2. Endotracheal and enteric tubes in place. 3. Low lung volumes with stable cardiomegaly. Electronically Signed   By: Jeb Levering M.D.   On: 03/06/2015 02:22   Dg Chest Portable 1 View  02/14/2015  CLINICAL DATA:  Intubation.  Code. EXAM: PORTABLE CHEST 1 VIEW COMPARISON:  10/30/2014 FINDINGS: Chronic cardiopericardial enlargement. Unchanged aortic and hilar contours when allowing for differences in technique. Endotracheal tube with tip between the clavicular heads and carina. There is an orogastric tube which reaches the stomach at least. The orogastric tube is tortuous, likely from left atrial enlargement. Pulmonary venous congestion with cephalized blood flow, at least partially related to supine positioning. No overt edema, effusion, or pneumothorax. Rounded lucency over the right chest wall is likely artifactual. No convincing subcutaneous gas. No visible fracture. IMPRESSION: 1. Located endotracheal and orogastric tubes. 2. Chronic cardiomegaly with venous congestion. Electronically Signed   By: Monte Fantasia M.D.   On: 02/14/2015 22:55      Assessment/Plan:   Active Problems:   Cardiac arrest ([employer])   NICM (nonischemic cardiomyopathy) ([place])   Acute on chronic combined systolic and diastolic heart failure ([place])   AKI (acute kidney injury) ([place])   Myoclonus   Encounter for central line placement   Acute encephalopathy  1. Out of hospital VT/VF cardiac arrest; on hypothermia protocol, now starting to be re-warmed. Current rhythm sinus. 30 - 36 minutes  ACLS prior to ROSC.  2. H/O NICM with EF  20 - 25%: substrate for VT/VF  2. Anoxic encephalopathy:  Neuro assessment   3. AKI: slowly improving Cr 2.2 --> 1.86  4. Elevated LFT secondary to shock liver; improving  Echo data pending. On amiodarone. If stabilized will need ICD. Once extubated, re-initiate oral CHF meds.  Troy Sine, MD, [facility] 02/16/2015, 12:36 PM

## 2015-02-16 NOTE — Progress Notes (Signed)
eLink Physician-Brief Progress Note Patient Name: Gregory Chase DOB: 01/05/1966 MRN: [id number]   Date of Service  02/16/2015  HPI/Events of Note  RN contacted regarding ventilator asynchrony & some myoclonus despite depakote. Keppra IV finishing. Camera chk shows patient with some roving eye movements. Wife at bedside.  eICU Interventions  1. Start Propofol IV 2. D/C Nimbex as patient rewarmed 3. Goal RASS now 0 to -1     Intervention Category Major Interventions: Respiratory failure - evaluation and management  Lawanda Cousins 02/16/2015, 11:39 PM

## 2015-02-16 NOTE — Progress Notes (Signed)
eLink Physician-Brief Progress Note Patient Name: Gregory Chase DOB: 12-01-1965 MRN: [id number]   Date of Service  02/16/2015  HPI/Events of Note  Hypomagnesemia 1.5  eICU Interventions  replaced     Intervention Category Minor Interventions: Electrolytes abnormality - evaluation and management  Henry Russel, P 02/16/2015, 1:26 AM

## 2015-02-16 NOTE — Progress Notes (Signed)
Subjective: Still being cold  Exam: Filed Vitals:   02/16/15 0800 02/16/15 0900  BP:    Pulse: 49 45  Temp: 91.6 F (33.1 C) 91.6 F (33.1 C)  Resp: 14 14   Gen: In bed, intubated Resp: Ventilated  Abd: soft, nt  Neuro: Pupils are small, minimally reactive, further exam precluded by paralytic   Pertinent Labs: Elevated creatinine  Impression: 50 year old male status post cardiac arrest with concern for anoxic brain injury. Unfortunately, due to apparatus and habitus portable CT was unable to be performed. Once cooling has been completed, I would favor some head imaging which could be helpful in determining prognosis.  Recommendations: 1) CT head once possible 2) neurology will continue to follow.  Ritta Slot, MD [facility] [phone]  If 7pm- 7am, please page neurology on call as listed in AMION.

## 2015-02-17 ENCOUNTER — Inpatient Hospital Stay (HOSPITAL_COMMUNITY): Payer: Managed Care, Other (non HMO)

## 2015-02-17 DIAGNOSIS — J9602 Acute respiratory failure with hypercapnia: Secondary | ICD-10-CM

## 2015-02-17 DIAGNOSIS — G931 Anoxic brain damage, not elsewhere classified: Secondary | ICD-10-CM

## 2015-02-17 LAB — BASIC METABOLIC PANEL
Anion gap: 16 — ABNORMAL HIGH (ref 5–15)
BUN: 29 mg/dL — ABNORMAL HIGH (ref 6–20)
CHLORIDE: 103 mmol/L (ref 101–111)
CO2: 19 mmol/L — ABNORMAL LOW (ref 22–32)
CREATININE: 2.92 mg/dL — AB (ref 0.61–1.24)
Calcium: 8.8 mg/dL — ABNORMAL LOW (ref 8.9–10.3)
GFR calc non Af Amer: 23 mL/min — ABNORMAL LOW (ref >60)
GFR, EST AFRICAN AMERICAN: 27 mL/min — AB (ref >60)
Glucose, Bld: 124 mg/dL — ABNORMAL HIGH (ref 65–99)
POTASSIUM: 4.5 mmol/L (ref 3.5–5.1)
SODIUM: 138 mmol/L (ref 135–145)

## 2015-02-17 LAB — CBC
HCT: 48.1 % (ref 39.0–52.0)
HEMOGLOBIN: 15.8 g/dL (ref 13.0–17.0)
MCH: 26.6 pg (ref 26.0–34.0)
MCHC: 32.8 g/dL (ref 30.0–36.0)
MCV: 80.8 fL (ref 78.0–100.0)
Platelets: 191 10*3/uL (ref 150–400)
RBC: 5.95 MIL/uL — AB (ref 4.22–5.81)
RDW: 16.9 % — ABNORMAL HIGH (ref 11.5–15.5)
WBC: 17.2 10*3/uL — ABNORMAL HIGH (ref 4.0–10.5)

## 2015-02-17 LAB — GLUCOSE, CAPILLARY
GLUCOSE-CAPILLARY: 112 mg/dL — AB (ref 65–99)
GLUCOSE-CAPILLARY: 97 mg/dL (ref 65–99)
GLUCOSE-CAPILLARY: 27 mg/dL — AB (ref 65–99)
GLUCOSE-CAPILLARY: 61 mg/dL — AB (ref 65–99)
GLUCOSE-CAPILLARY: 53 mg/dL — AB (ref 65–99)
Glucose-Capillary: 102 mg/dL — ABNORMAL HIGH (ref 65–99)
Glucose-Capillary: 132 mg/dL — ABNORMAL HIGH (ref 65–99)
Glucose-Capillary: 64 mg/dL — ABNORMAL LOW (ref 65–99)
Glucose-Capillary: 55 mg/dL — ABNORMAL LOW (ref 65–99)
Glucose-Capillary: 50 mg/dL — ABNORMAL LOW (ref 65–99)
Glucose-Capillary: 76 mg/dL (ref 65–99)
Glucose-Capillary: 40 mg/dL — CL (ref 65–99)

## 2015-02-17 LAB — HEPATIC FUNCTION PANEL
ALK PHOS: 68 U/L (ref 38–126)
ALT: 61 U/L (ref 17–63)
AST: 38 U/L (ref 15–41)
Albumin: 2.7 g/dL — ABNORMAL LOW (ref 3.5–5.0)
BILIRUBIN DIRECT: 0.3 mg/dL (ref 0.1–0.5)
BILIRUBIN TOTAL: 1.1 mg/dL (ref 0.3–1.2)
Indirect Bilirubin: 0.8 mg/dL (ref 0.3–0.9)
Total Protein: 5.9 g/dL — ABNORMAL LOW (ref 6.5–8.1)

## 2015-02-17 LAB — POCT I-STAT 3, ART BLOOD GAS (G3+)
ACID-BASE DEFICIT: 9 mmol/L — AB (ref 0.0–2.0)
BICARBONATE: 19.6 meq/L — AB (ref 20.0–24.0)
O2 SAT: 93 %
Patient temperature: 36
TCO2: 21 mmol/L (ref 0–100)
pCO2 arterial: 49.3 mmHg — ABNORMAL HIGH (ref 35.0–45.0)
pH, Arterial: 7.203 — ABNORMAL LOW (ref 7.350–7.450)
pO2, Arterial: 78 mmHg — ABNORMAL LOW (ref 80.0–100.0)

## 2015-02-17 LAB — MAGNESIUM: MAGNESIUM: 2.1 mg/dL (ref 1.7–2.4)

## 2015-02-17 LAB — TRIGLYCERIDES
Triglycerides: 149 mg/dL (ref <150)
Triglycerides: 154 mg/dL — ABNORMAL HIGH (ref <150)

## 2015-02-17 LAB — PROCALCITONIN: PROCALCITONIN: 3.59 ng/mL

## 2015-02-17 LAB — PHOSPHORUS: PHOSPHORUS: 7.3 mg/dL — AB (ref 2.5–4.6)

## 2015-02-17 LAB — BRAIN NATRIURETIC PEPTIDE: B Natriuretic Peptide: 289.2 pg/mL — ABNORMAL HIGH (ref 0.0–100.0)

## 2015-02-17 MED ORDER — DEXTROSE 50 % IV SOLN
INTRAVENOUS | Status: AC
  Administered 2015-02-17: 1
  Filled 2015-02-17: qty 50

## 2015-02-17 MED ORDER — DEXTROSE 50 % IV SOLN
1.0000 | Freq: Once | INTRAVENOUS | Status: AC
Start: 2015-02-17 — End: 2015-02-17
  Administered 2015-02-17: 50 mL via INTRAVENOUS

## 2015-02-17 MED ORDER — PRO-STAT SUGAR FREE PO LIQD
60.0000 mL | Freq: Two times a day (BID) | ORAL | Status: DC
Start: 2015-02-17 — End: 2015-02-18
  Administered 2015-02-17 – 2015-02-18 (×3): 60 mL
  Filled 2015-02-17 (×3): qty 60

## 2015-02-17 MED ORDER — DEXTROSE 50 % IV SOLN
INTRAVENOUS | Status: AC
  Administered 2015-02-17: 50 mL
  Filled 2015-02-17: qty 50

## 2015-02-17 MED ORDER — DEXTROSE 5 % IV SOLN
500.0000 mg | Freq: Three times a day (TID) | INTRAVENOUS | Status: DC
  Administered 2015-02-17 – 2015-02-18 (×3): 500 mg via INTRAVENOUS
  Filled 2015-02-17 (×7): qty 5

## 2015-02-17 MED ORDER — ROCURONIUM BROMIDE 50 MG/5ML IV SOLN
50.0000 mg | Freq: Once | INTRAVENOUS | Status: AC
  Administered 2015-02-17: 50 mg via INTRAVENOUS

## 2015-02-17 MED ORDER — LORAZEPAM 2 MG/ML IJ SOLN
INTRAMUSCULAR | Status: AC
  Administered 2015-02-17: 4 mg
  Filled 2015-02-17: qty 2

## 2015-02-17 MED ORDER — PROPOFOL 1000 MG/100ML IV EMUL
5.0000 ug/kg/min | INTRAVENOUS | Status: DC
  Administered 2015-02-17: 30 ug/kg/min via INTRAVENOUS
  Administered 2015-02-17: 10 ug/kg/min via INTRAVENOUS
  Administered 2015-02-17: 30 ug/kg/min via INTRAVENOUS
  Administered 2015-02-18 (×3): 40 ug/kg/min via INTRAVENOUS
  Filled 2015-02-17 (×7): qty 100

## 2015-02-17 MED ORDER — VITAL HIGH PROTEIN PO LIQD
1000.0000 mL | ORAL | Status: DC
  Administered 2015-02-17 – 2015-02-18 (×2): 1000 mL

## 2015-02-17 MED ORDER — LABETALOL HCL 5 MG/ML IV SOLN
0.5000 mg/min | INTRAVENOUS | Status: DC
  Filled 2015-02-17: qty 100

## 2015-02-17 MED ORDER — LEVETIRACETAM 500 MG/5ML IV SOLN
1000.0000 mg | Freq: Two times a day (BID) | INTRAVENOUS | Status: DC
  Administered 2015-02-17 – 2015-02-18 (×2): 1000 mg via INTRAVENOUS
  Filled 2015-02-17 (×3): qty 10

## 2015-02-17 MED ORDER — DEXTROSE 50 % IV SOLN
INTRAVENOUS | Status: AC
  Filled 2015-02-17: qty 50

## 2015-02-17 MED ORDER — PROPOFOL 1000 MG/100ML IV EMUL
INTRAVENOUS | Status: AC
  Filled 2015-02-17: qty 100

## 2015-02-17 NOTE — Progress Notes (Signed)
Brief Nutrition Note  Consult received for enteral/tube feeding initiation and management.  Adult Enteral Nutrition Protocol initiated. Order adjusted to reflect recommendations provided in initial assessment on 2/3. RD to follow-up 2/6.  Admitting Dx: POST CPR CARDAIC ARREST  Body mass index is 38.69 kg/(m^2). Pt meets criteria for obesity based on current BMI.  Labs:   Recent Labs Lab 02/16/15 0030 02/16/15 0400  02/16/15 1620 02/16/15 2048 02/17/15 0420  NA  --   --   < > 139 140 138  K  --   --   < > 3.8 4.0 4.5  CL  --   --   < > 106 108 103  CO2  --   --   < > 22 21* 19*  BUN  --   --   < > 25* 25* 29*  CREATININE  --   --   < > 2.04* 2.06* 2.92*  CALCIUM  --   --   < > 8.4* 8.8* 8.8*  MG 1.5* 2.1  --   --   --  2.1  PHOS  --   --   --   --   --  7.3*  GLUCOSE  --   --   < > 132* 129* 124*  < > = values in this interval not displayed.  Tilda Franco, MS, RD, LDN Pager: [phone] After Hours Pager: [phone]

## 2015-02-17 NOTE — Progress Notes (Signed)
PULMONARY / CRITICAL CARE MEDICINE   Name: Gregory Chase MRN: [id number] DOB: 08/07/1965    ADMISSION DATE:  03/08/2015 CONSULTATION DATE:  02/20/2015  REFERRING MD:  [facility]  CHIEF COMPLAINT:  Cardiac Arrest.  HISTORY OF PRESENT ILLNESS:  Pt is encephelopathic; therefore, this HPI is obtained from chart review. Gregory Chase is a 50 y.o. 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  He had recent admission to [facility] 10/30/14 through 11/01/14 for AoC systolic and diastolic CHF.  On 02/02, he was brought to [facility] with cardiac arrest.  He was using restroom at home and wife heard a loud noise.  When she went to restroom, she found him on the floor in between the commode and the tub.  He was apparently unresponsive and shaking as if he were seizing.  She was unable to arouse him; therefore, she called EMS.  Firefighters were first to scene and on their arrival, pt reportedly did have a pulse.  While they were there, he lost pulses and had VT / VF arrest.  He received 1 shock and 4 epi's prior to ROSC.  Total time of ACLS was roughly 30 minutes.  In ED, he was intubated for airway protection and was then transferred to [facility] for further evaluation and management.  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  No acute process. CXR 02/02: cardiomegaly with vascular congestion. Port CXR 2/3: ETT 2.6cm above carina. No focal consolidation. EEG 2/3:  Pending  MICROBIOLOGY: Blood 02/03 > Urine 02/03 > Sputum 02/03 > MRSA PCR 2/3:  Negative  ANTIBIOTICS: None.  SIGNIFICANT EVENTS: 02/02 - admitted following cardiac arrest.  LINES/TUBES: OETT 8.0 02/02 > R Fem CVL 02/03 > R Fem A Line 02/03 > OGT 02/02 > Foley 02/02 > PIV x3  SUBJECTIVE: Myoclonus overnight.  Completely unresponsive.  VITAL SIGNS: BP 108/69 mmHg  Pulse 86  Temp(Src) 98.6 F (37 C) (Core (Comment))  Resp 14  Ht 6\' 1"  (1.854 m)   Wt 133 kg (293 lb 3.4 oz)  BMI 38.69 kg/m2  SpO2 100%  HEMODYNAMICS: CVP:  [4 mmHg-18 mmHg] 8 mmHg  VENTILATOR SETTINGS: Vent Mode:  [-] PRVC FiO2 (%):  [40 %] 40 % Set Rate:  [14 bmp-24 bmp] 24 bmp Vt Set:  [620 mL] 620 mL PEEP:  [5 cmH20] 5 cmH20 Plateau Pressure:  [20 cmH20-28 cmH20] 23 cmH20  INTAKE / OUTPUT: I/O last 3 completed shifts: In: 2837.4 [I.V.:2397.4; NG/GT:120; IV Piggyback:320] Out: 1035 [Urine:1035]  PHYSICAL EXAMINATION: General: Obese AA male, sedated and does not withdraw to pain.Marland Kitchen Neuro: Sedated, non-responsive. Pupils are pinpoint, no corneals.  Respiratory drive intact. HEENT: [place]/AT. PERRL, sclerae anicteric. Cardiovascular: RRR, no M/R/G.  Lungs: Respirations shallow and unlabored.  CTA bilaterally, No W/R/R. Abdomen: Obese, BS x 4, soft, NT/ND.  Musculoskeletal: No gross deformities, no edema.  Skin: Intact, warm, no rashes.  LABS:  BMET  Recent Labs Lab 02/16/15 1620 02/16/15 2048 02/17/15 0420  NA 139 140 138  K 3.8 4.0 4.5  CL 106 108 103  CO2 22 21* 19*  BUN 25* 25* 29*  CREATININE 2.04* 2.06* 2.92*  GLUCOSE 132* 129* 124*    Electrolytes  Recent Labs Lab 02/16/15 0030 02/16/15 0400  02/16/15 1620 02/16/15 2048 02/17/15 0420  CALCIUM  --   --   < > 8.4* 8.8* 8.8*  MG 1.5* 2.1  --   --   --  2.1  PHOS  --   --   --   --   --  7.3*  < > = values in this interval not displayed. CBC  Recent Labs Lab 02/14/15 2211  02/16/15 0400 02/16/15 0401 02/17/15 0420  WBC 16.5*  --  13.9*  --  17.2*  HGB 15.4  < > 15.2 17.7* 15.8  HCT 49.8  < > 45.9 52.0 48.1  PLT 224  --  170  --  191  < > = values in this interval not displayed.  Coag's  Recent Labs Lab 02/14/15 2211 Feb 24, 2015 0301 02-24-2015 0402 24-Feb-2015 1030  APTT 32  --  25 28  INR 1.18 1.20 1.21 1.18   Sepsis Markers  Recent Labs Lab Feb 24, 2015 0301 02-24-15 1030 Feb 24, 2015 1445 24-Feb-2015 1956 02/16/15 0400 02/17/15 0420  LATICACIDVEN  --  1.8 1.7 1.6  --    --   PROCALCITON 1.76  --   --   --  3.69 3.59   ABG  Recent Labs Lab 02/14/15 2301 2015/02/24 0209 02/17/15 0353  PHART 7.25* 7.278* 7.203*  PCO2ART 48 48.5* 49.3*  PO2ART 231* 159.0* 78.0*   Liver Enzymes  Recent Labs Lab 02/14/15 2211  AST 99*  ALT 104*  ALKPHOS 107  BILITOT 0.9  ALBUMIN 3.6   Cardiac Enzymes  Recent Labs Lab 2015/02/24 0640 02/24/15 1530 2015-02-24 2030  TROPONINI 0.36* 0.14* 0.09*   Glucose  Recent Labs Lab 02/16/15 1608 02/16/15 2037 02/17/15 02/17/15 0416 02/17/15 0739 02/17/15 0742  GLUCAP 110* 132* 102* 112* 64* 97   Imaging Ct Head Wo Contrast  02/17/2015  CLINICAL DATA:  50 year old male status post cardiac arrest and fall, began having generalized myoclonus overnight. Query anoxic brain injury. Initial encounter. EXAM: CT HEAD WITHOUT CONTRAST CT CERVICAL SPINE WITHOUT CONTRAST TECHNIQUE: Multidetector CT imaging of the head and cervical spine was performed following the standard protocol without intravenous contrast. Multiplanar CT image reconstructions of the cervical spine were also generated. COMPARISON:  CT head and cervical spine 02/14/2015. Brain MRI 02/18/2012. FINDINGS: CT HEAD FINDINGS Intubated. Mild paranasal sinus mucosal thickening, small fluid level in the right maxillary sinus. Tympanic cavities and mastoids remain clear. Calvarium appears intact. Stable orbit and scalp soft tissues. Calcified atherosclerosis at the skull base. No ventriculomegaly. No acute intracranial hemorrhage identified. There has been subtle interval loss of gray-white matter differentiation and sulci conspicuity in both hemispheres. Confluent hypodensity in the right PCA and posterior left MCA/ PCA watershed territory re - demonstrated and not significantly changed. Basilar cisterns remain patent. No midline shift. CT CERVICAL SPINE FINDINGS Large body habitus. Stable straightening of cervical lordosis. Visualized skull base is intact. No atlanto-occipital  dissociation. Cervicothoracic junction alignment is within normal limits. Bilateral posterior element alignment is within normal limits. No acute cervical spine fracture. Grossly intact visualized upper thoracic levels. 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 Intubated and oral enteric tube in place. Fluid in the pharynx. Endotracheal tube courses into the trachea, enteric tube courses into the thoracic esophagus. Mild dependent atelectasis in the lung apices. IMPRESSION: 1. No intracranial mass effect or loss of basilar cisterns, but gray-white matter differentiation and cortical sulci have decreased in conspicuity since 02/14/2015, highly suspicious for diffuse cerebral edema from widespread anoxic injury in this setting. 2. Stable appearance of acute to subacute right PCA and posterior left MCA/PCA infarcts.  No intracranial hemorrhage. 3. No acute fracture or listhesis identified in the cervical spine. Ligamentous injury is not excluded. 4. 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 Electronically Signed   By: Odessa Fleming M.D.   On: 02/17/2015 10:18   Ct Cervical Spine Wo Contrast  02/17/2015  CLINICAL DATA:  50 year old male status post cardiac arrest and fall, began having generalized myoclonus overnight. Query anoxic brain injury. Initial encounter. EXAM: CT HEAD WITHOUT CONTRAST CT CERVICAL SPINE WITHOUT CONTRAST TECHNIQUE: Multidetector CT imaging of the head and cervical spine was performed following the standard protocol without intravenous contrast. Multiplanar CT image reconstructions of the cervical spine were also generated. COMPARISON:  CT head and cervical spine 02/14/2015. Brain MRI 02/18/2012. FINDINGS: CT HEAD FINDINGS Intubated. Mild paranasal  sinus mucosal thickening, small fluid level in the right maxillary sinus. Tympanic cavities and mastoids remain clear. Calvarium appears intact. Stable orbit and scalp soft tissues. Calcified atherosclerosis at the skull base. No ventriculomegaly. No acute intracranial hemorrhage identified. There has been subtle interval loss of gray-white matter differentiation and sulci conspicuity in both hemispheres. Confluent hypodensity in the right PCA and posterior left MCA/ PCA watershed territory re - demonstrated and not significantly changed. Basilar cisterns remain patent. No midline shift. CT CERVICAL SPINE FINDINGS Large body habitus. Stable straightening of cervical lordosis. Visualized skull base is intact. No atlanto-occipital dissociation. Cervicothoracic junction alignment is within normal limits. Bilateral posterior element alignment is within normal limits. No acute cervical spine fracture. Grossly intact visualized upper thoracic levels. 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 Intubated and oral enteric tube in place. Fluid in the pharynx. Endotracheal tube courses into the trachea, enteric tube courses into the thoracic esophagus. Mild dependent atelectasis in the lung apices. IMPRESSION: 1. No intracranial mass effect or loss of basilar cisterns, but gray-white matter differentiation and cortical sulci have decreased in conspicuity since 02/14/2015, highly suspicious for diffuse cerebral edema from widespread anoxic injury in this setting. 2. Stable appearance of acute to subacute right PCA and posterior left MCA/PCA infarcts. No intracranial hemorrhage. 3. No acute fracture or listhesis identified in the cervical spine. Ligamentous injury is not excluded. 4. Mild to moderate degenerative cervical spinal stenosis from C6-C7 to the mid T1 level related to  ossification of the posterior longitudinal ligament. Electronically Signed   By: Odessa Fleming M.D.   On: 02/17/2015 10:18   Dg Chest Port 1 View  02/17/2015  CLINICAL DATA:  HX of ETT EXAM: PORTABLE CHEST 1 VIEW COMPARISON:  02/24/2015 FINDINGS: The trachea tube 3.5 cm from carina. NG tube in stomach. Stable enlarged cardiac silhouette. LEFT basilar atelectasis. Upper lungs are clear. IMPRESSION: 1. Endotracheal tube in good position. 2. Improvement in central venous congestion.  Cardiomegaly Electronically Signed   By: Genevive Bi M.D.   On: 02/17/2015 07:36    ASSESSMENT / PLAN:  CARDIOVASCULAR A:  S/p cardiac arrest - roughly 30 - 35  minutes ACLS prior to ROSC. Hx combined heart failure - Echo from Oct 2016 with LVEF 18%, grade 1 DD. P:  Warmed. Goal MAP > 65 during hypothermia. Echo noted per cards. Cardiology consult appreciated. Continue outpatient amiodarone. Hold outpatient carvedilol, digoxin, furosemide, entresto given hypotension.  NEUROLOGIC A:   Acute metabolic encephalopathy with concern for anoxic brain  injury. ? Seizure disorder - reportedly had seizure at home prior to arrest. CT of the head with diffuse anoxic injury. P:   Sedation:  D/C all sedation. Hold WUA until off paralytics. Keppra IV bid EEG per neuro. Neuro consult appreciated. UDS positive for benzos only but unclear when that was drawn in relation to drugs being used in patient. Keppra and depakote for myoclonus control.  PULMONARY A: VDRF due to inability to protect airway - secondary to cardiac arrest. Mild pulmonary edema. OSA / probable OHS.  P:   Full vent support, hold weaning given mental status. ABG and adjust vent accordingly. Albuterol PRN. Port CXR tomorrow AM ABG in AM.  RENAL A:   Hypokalemia - Replacing. Acute Renal Failure Lactic Acidosis - Mild.  P:   NS @ 100. Correct electrolytes as indicated. Trending Renal Function per protocol. Monitor UOP with  Foley.  GASTROINTESTINAL A:   No acute issues.  P:   Protonix IV daily. Consult nutrition for TF as per nutrition.  HEMATOLOGIC A:   No acute issues.  P:  SCD's Heparin [place] q8hr Coags q8 x 2. CBC in AM 2/4  INFECTIOUS A:   No acute issues.  P:   F/U on cultures. Trending Procalcitonin per algorithm.  ENDOCRINE A:   Hyperglycemia - no h/o DM.  P:   ICU hyperglycemia protocol. Assess Hgb A1c. Hold abx as ordered.  Family updated: No family bedside.  Interdisciplinary Family Meeting v Palliative Care Meeting:  Due by: 02/21/15.  The patient is critically ill with multiple organ systems failure and requires high complexity decision making for assessment and support, frequent evaluation and titration of therapies, application of advanced monitoring technologies and extensive interpretation of multiple databases.   Critical Care Time devoted to patient care services described in this note is  35  Minutes. This time reflects time of care of this signee Dr Koren Bound. This critical care time does not reflect procedure time, or teaching time or supervisory time of PA/NP/Med student/Med Resident etc but could involve care discussion time.  Alyson Reedy, M.D. [facility] Pulmonary/Critical Care Medicine. Pager: [phone]. After hours pager: [phone].

## 2015-02-17 NOTE — Progress Notes (Signed)
Subjective: Began having generalized myoclonus overnight.   Exam: Filed Vitals:   02/17/15 0600 02/17/15 0700  BP:    Pulse: 81 86  Temp: 98.4 F (36.9 C) 98.6 F (37 C)  Resp:     Gen: In bed, NAD Resp: non-labored breathing, no acute distress Abd: soft, nt  Neuro: MS: does not open eyes or follow commands.  HD:QQIWL, corneals intact.  Motor: does nto withdraw Sensory:no response to noxious stimuli.   Impression: 50 yo M with anoxic brain injury following cardiac arrest. I think we need to obtain a stat head CT. I did not see the myoclonus observed, but likley will be a poor prognostic indicator. If he doe snot have generalized edema, then will get STAT EEG to establish if status epilepticus.   Recommendations: 1) CT head 2) control of movements with keppra, depakote, propofol.   Ritta Slot, MD [facility] [phone]  If 7pm- 7am, please page neurology on call as listed in AMION.

## 2015-02-17 NOTE — Progress Notes (Addendum)
EEG shows myoclonic status. 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 I feel that he has a poor prognosis without significant chance of recovery to independent function and have discussed this with the family.   1) Continue keppra,propofol, and depakote for control of movements 2) care to the extent of family wishes.   Ritta Slot, MD [facility] [phone]  If 7pm- 7am, please page neurology on call as listed in AMION.

## 2015-02-17 NOTE — Procedures (Addendum)
History: 50 year old male status post cardiac arrest with myoclonic movements.  Sedation: Low-dose propofol  Technique: This is a 21 channel routine scalp EEG performed at the bedside with bipolar and monopolar montages arranged in accordance to the international 10/20 system of electrode placement. One channel was dedicated to EKG recording.    Background: There are generalized periodic discharges with a frequency of 2 Hz throughout the recording. There is occasional other slow activity at times, but the majority of the EEG is dominated by these discharges. They become less prominent in amplitude, but otherwise unchanged following treatment with Ativan.  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  Ritta Slot, MD [facility] [phone]  If 7pm- 7am, please page neurology on call as listed in AMION.

## 2015-02-17 NOTE — Progress Notes (Signed)
Prolonged EEG completed; results pending  

## 2015-02-17 NOTE — Progress Notes (Signed)
RT increased RR to 24 per MD Order and and ABG results. RT will continue to monitor

## 2015-02-17 NOTE — Progress Notes (Signed)
eLink Physician-Brief Progress Note Patient Name: ZIEN TOSCH DOB: 05-29-1965 MRN: [id number]   Date of Service  02/17/2015  HPI/Events of Note  RN notified of ABG result:  7.20/49/78 on PRVC 620cc & spontaneous rate 18.   eICU Interventions  Change Set Rate to 24.     Intervention Category Major Interventions: Hypercarbia - evaluation and management  Lawanda Cousins 02/17/2015, 4:08 AM

## 2015-02-17 NOTE — Progress Notes (Signed)
Subjective:  Intubated; sedated on vent;  Rewarmed.  Recurrent myoclonal seizures now on propofol and depacom.  Objective:   Vital Signs : Filed Vitals:   02/17/15 0400 02/17/15 0500 02/17/15 0600 02/17/15 0700  BP:      Pulse: 86 80 81 86  Temp: 97.2 F (36.2 C) 97.5 F (36.4 C) 98.4 F (36.9 C) 98.6 F (37 C)  TempSrc: Core (Comment)     Resp:      Height:      Weight:      SpO2: 100% 100% 100% 100%    Intake/Output from previous day:  Intake/Output Summary (Last 24 hours) at 02/17/15 0809 Last data filed at 02/17/15 0600  Gross per 24 hour  Intake 1719.93 ml  Output    770 ml  Net 949.93 ml    I/O since admission: -307.2  Wt Readings from Last 3 Encounters:  02/16/15 293 lb 3.4 oz (133 kg)  02/14/15 310 lb 13.6 oz (141 kg)  11/01/14 281 lb 6.4 oz (127.642 kg)    Medications: . sodium chloride  2,000 mL Intravenous Once  . amiodarone  200 mg Per Tube Daily  . antiseptic oral rinse  7 mL Mouth Rinse 10 times per day  . artificial tears  1 application Both Eyes 3 times per day  . chlorhexidine gluconate  15 mL Mouth Rinse BID  . heparin  5,000 Units Subcutaneous 3 times per day  . insulin aspart  2-6 Units Subcutaneous 6 times per day  . levETIRAcetam  500 mg Intravenous Q12H  . pantoprazole (PROTONIX) IV  40 mg Intravenous QHS  . sodium chloride flush  10-40 mL Intracatheter Q12H    . fentaNYL infusion INTRAVENOUS Stopped (02/16/15 2205)  . midazolam (VERSED) infusion Stopped (02/16/15 2205)  . norepinephrine (LEVOPHED) Adult infusion 19 mcg/min (02/17/15 0551)  . propofol (DIPRIVAN) infusion 15 mcg/kg/min (02/17/15 0618)    Physical Exam:   General appearance: Intubated, sedated and paralyzed on vent; Obese Neck: no adenopathy, no carotid bruit, no JVD, supple, symmetrical, trachea midline and thyroid not enlarged, symmetric, no tenderness/mass/nodules Lungs: no wheezing Heart: regular rate and rhythm Abdomen: soft, non-tender; bowel sounds  normal; no masses,  no organomegaly Extremities: no edema, redness or tenderness in the calves or thighs Pulses: 2+ and symmetric Skin: warm; no rashes Neurologic: not able to assess.   Rate: 89  Rhythm: normal sinus rhythm with PVC's.   2/3 ECG (independently read by me): NSR at 64 with PVC's diffuse ST changes 1, aVL, V4-6  2/2 ECG (independently read by me): Sinus with PVC's at 96  Lab Results:   Recent Labs  02/16/15 0030 02/16/15 0400  02/16/15 1620 02/16/15 2048 02/17/15 0420  NA  --   --   < > 139 140 138  K  --   --   < > 3.8 4.0 4.5  CL  --   --   < > 106 108 103  CO2  --   --   < > 22 21* 19*  GLUCOSE  --   --   < > 132* 129* 124*  BUN  --   --   < > 25* 25* 29*  CREATININE  --   --   < > 2.04* 2.06* 2.92*  CALCIUM  --   --   < > 8.4* 8.8* 8.8*  MG 1.5* 2.1  --   --   --  2.1  PHOS  --   --   --   --   --  7.3*  < > = values in this interval not displayed.  Hepatic Function Latest Ref Rng 02/14/2015 10/30/2014 03/16/2013  Total Protein 6.5 - 8.1 g/dL 6.8 6.8 6.6  Albumin 3.5 - 5.0 g/dL 3.6 3.7 3.2(L)  AST 15 - 41 U/L 99(H) 25 47(H)  ALT 17 - 63 U/L 104(H) 22 43  Alk Phosphatase 38 - 126 U/L 107 76 87  Total Bilirubin 0.3 - 1.2 mg/dL 0.9 1.0 1.1(H)     Recent Labs  02/14/15 2211  02/16/15 0400 02/16/15 0401 02/17/15 0420  WBC 16.5*  --  13.9*  --  17.2*  NEUTROABS 5.8  --   --   --   --   HGB 15.4  < > 15.2 17.7* 15.8  HCT 49.8  < > 45.9 52.0 48.1  MCV 82.7  --  79.5  --  80.8  PLT 224  --  170  --  191  < > = values in this interval not displayed.   Recent Labs  03/01/2015 0640 03/10/2015 1530 03/03/2015 2030  TROPONINI 0.36* 0.14* 0.09*    Lab Results  Component Value Date   TSH 1.198 03/12/2015   No results for input(s): HGBA1C in the last 72 hours.    Recent Labs  03/01/2015 1030  INR 1.18    BNP (last 3 results)  Recent Labs  10/30/14 0631 02/14/15 2211  BNP 678.0* 323.0*    ProBNP (last 3 results) No results for input(s):  PROBNP in the last 8760 hours.   Lipid Panel     Component Value Date/Time   CHOL 124 02/19/2012 0523   TRIG 149 02/17/2015 0420   TRIG 108 02/19/2012 0523   HDL 32* 02/19/2012 0523   VLDL 22 02/19/2012 0523   LDLCALC 70 02/19/2012 0523     Imaging:  Dg Chest Port 1 View  02/17/2015  CLINICAL DATA:  HX of ETT EXAM: PORTABLE CHEST 1 VIEW COMPARISON:  03/05/2015 FINDINGS: The trachea tube 3.5 cm from carina. NG tube in stomach. Stable enlarged cardiac silhouette. LEFT basilar atelectasis. Upper lungs are clear. IMPRESSION: 1. Endotracheal tube in good position. 2. Improvement in central venous congestion.  Cardiomegaly Electronically Signed   By: Suzy Bouchard M.D.   On: 02/17/2015 07:36      Assessment/Plan:   Active Problems:   Cardiac arrest ([place])   NICM (nonischemic cardiomyopathy) ([place])   Acute on chronic combined systolic and diastolic heart failure (H[facility])   AKI (acute kidney injury) ([place])   Myoclonus   Encounter for central line placement   Acute encephalopathy   Anoxic brain injury ([place])  1. Out of hospital VT/VF cardiac arrest; on hypothermia protocol, now re-warmed to 37. Current rhythm sinus. 30 - 36 minutes ACLS prior to ROSC.  2. H/O NICM with EF 20 - 25%: substrate for VT/VF;  Currently on levophed at 20 ug.   2. Anoxic encephalopathy with recurrent myoclonus; now on keppra and depacom.   3. AKI: improved yesterday to 1.6 now increased to 2.92  4. Elevated LFT secondary to shock liver;  Will re-check today   Echo ? Not yet done, no result. On amiodarone orally. If stabilized will need ICD. Once extubated, re-initiate oral CHF meds.  Troy Sine, MD, [facility] 02/17/2015, 8:09 AM

## 2015-02-18 ENCOUNTER — Inpatient Hospital Stay (HOSPITAL_COMMUNITY): Payer: Managed Care, Other (non HMO)

## 2015-02-18 ENCOUNTER — Other Ambulatory Visit (HOSPITAL_COMMUNITY): Payer: Self-pay

## 2015-02-18 LAB — CULTURE, RESPIRATORY

## 2015-02-18 LAB — BLOOD GAS, ARTERIAL
Acid-base deficit: 2.6 mmol/L — ABNORMAL HIGH (ref 0.0–2.0)
BICARBONATE: 22.1 meq/L (ref 20.0–24.0)
Drawn by: 36277
FIO2: 0.5
LHR: 24 {breaths}/min
O2 SAT: 98.5 %
PATIENT TEMPERATURE: 98.6
PEEP/CPAP: 5 cmH2O
PO2 ART: 159 mmHg — AB (ref 80.0–100.0)
TCO2: 23.4 mmol/L (ref 0–100)
VT: 620 mL
pCO2 arterial: 40.7 mmHg (ref 35.0–45.0)
pH, Arterial: 7.355 (ref 7.350–7.450)

## 2015-02-18 LAB — GLUCOSE, CAPILLARY
GLUCOSE-CAPILLARY: 55 mg/dL — AB (ref 65–99)
GLUCOSE-CAPILLARY: 78 mg/dL (ref 65–99)
Glucose-Capillary: 74 mg/dL (ref 65–99)
Glucose-Capillary: 79 mg/dL (ref 65–99)

## 2015-02-18 LAB — BASIC METABOLIC PANEL
ANION GAP: 10 (ref 5–15)
BUN: 29 mg/dL — ABNORMAL HIGH (ref 6–20)
CO2: 23 mmol/L (ref 22–32)
Calcium: 8.7 mg/dL — ABNORMAL LOW (ref 8.9–10.3)
Chloride: 106 mmol/L (ref 101–111)
Creatinine, Ser: 1.96 mg/dL — ABNORMAL HIGH (ref 0.61–1.24)
GFR calc Af Amer: 44 mL/min — ABNORMAL LOW (ref >60)
GFR, EST NON AFRICAN AMERICAN: 38 mL/min — AB (ref >60)
GLUCOSE: 85 mg/dL (ref 65–99)
POTASSIUM: 4.2 mmol/L (ref 3.5–5.1)
Sodium: 139 mmol/L (ref 135–145)

## 2015-02-18 LAB — CULTURE, RESPIRATORY W GRAM STAIN

## 2015-02-18 LAB — CBC
HCT: 43.6 % (ref 39.0–52.0)
Hemoglobin: 14.2 g/dL (ref 13.0–17.0)
MCH: 26.2 pg (ref 26.0–34.0)
MCHC: 32.6 g/dL (ref 30.0–36.0)
MCV: 80.4 fL (ref 78.0–100.0)
Platelets: 139 10*3/uL — ABNORMAL LOW (ref 150–400)
RBC: 5.42 MIL/uL (ref 4.22–5.81)
RDW: 16.9 % — ABNORMAL HIGH (ref 11.5–15.5)
WBC: 7.8 10*3/uL (ref 4.0–10.5)

## 2015-02-18 LAB — HEMOGLOBIN A1C
HEMOGLOBIN A1C: 5.8 % — AB (ref 4.8–5.6)
MEAN PLASMA GLUCOSE: 120 mg/dL

## 2015-02-18 LAB — TRIGLYCERIDES: Triglycerides: 199 mg/dL — ABNORMAL HIGH (ref <150)

## 2015-02-18 LAB — PHOSPHORUS: Phosphorus: 3.9 mg/dL (ref 2.5–4.6)

## 2015-02-18 LAB — MAGNESIUM: Magnesium: 1.9 mg/dL (ref 1.7–2.4)

## 2015-02-18 MED ORDER — MORPHINE SULFATE 25 MG/ML IV SOLN
1.0000 mg/h | INTRAVENOUS | Status: DC
  Administered 2015-02-18: 2 mg/h via INTRAVENOUS
  Filled 2015-02-18 (×2): qty 10

## 2015-02-18 MED FILL — Medication: Qty: 1 | Status: AC

## 2015-02-20 LAB — CULTURE, BLOOD (ROUTINE X 2)
CULTURE: NO GROWTH
CULTURE: NO GROWTH

## 2015-02-25 ENCOUNTER — Telehealth: Payer: Self-pay

## 2015-02-25 NOTE — Telephone Encounter (Signed)
On 02/25/2015 I received a death certificate from [facility] (original). The death certificate is for burial. The patient is a patient of Doctor Byrum. The death certificate will be taken to [facility] ([id number]) this am for signature so that Doctor Byrum can sign it this evening when he works. On 2015/03/17 I received the death certificate back from Doctor Byrum. I got the death certificate ready and called the funeral home to let them know I was mailing the death certificate to the [facility] per their request.

## 2015-03-13 NOTE — Discharge Summary (Signed)
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 M with PMH of chronic combined systolic and diastolic CHF (Echo from Oct 2016 with LVEF 18% grade 1DD) and OSA.  He had recent admission to [facility] 10/30/14 through 11/01/14 for AoC systolic and diastolic CHF.  On 02/02, he was brought to [facility] with cardiac arrest.  He was using restroom at home and wife heard a loud noise. She found him on the floor in between the commode and the tub.  Firefighters were first to scene and on their arrival, pt reportedly did have a pulse.  While they were there, he lost pulses and had VT / VF arrest.  He received 1 shock and 4 epi's prior to ROSC.  Total time of ACLS was roughly 30 minutes.  In ED, he was intubated for airway protection and was then transferred to [facility] for further evaluation and management. He was supported with IVF, started on amiodarone. Head CT scan did not show an acute process, EEG 2/5 was consistent with status epilepticus. In consultation with Neurology it was felt that he would not benefit from induced medical coma given likelihood for an anoxic brain injury and overall poor prognosis. Follow up head Ct 2/5 was consistent with an evolving hypoxic injury. Patient also experienced progressive renal failure. Discussion  undertaken with the family of the patient on 2/6 and given his poor prognosis remained poor recovery the decision was made to transition him to comfort approach. This was done on 2/6 and he was extubated. He expired later that same day.      Levy Pupa, MD, PhD 03/07/2015, 1:09 PM [facility] [phone] or if no answer [phone]

## 2015-03-13 NOTE — Clinical Documentation Improvement (Signed)
Critical Care  Can the diagnosis of "Hypotension" be further specified? Please document response in next progress note NOT in BPA drop down box. Thanks!   Cardiovascular collapse - Cardiogenic Shock  Hypovolemic Shock  Hypotension as documented  Other  Clinically Undetermined  Supporting Information:  Being treated with Levophed 20ug  Please exercise your independent, professional judgment when responding. A specific answer is not anticipated or expected.  Thank You, Shellee Milo RN, BSN, CCDS Health Information Management [place] [phone]; Cell: [phone]

## 2015-03-13 NOTE — Progress Notes (Signed)
    Subjective:  Intubated; unresponsive   Objective:  Filed Vitals:   03-03-15 0442 2015/03/03 0500 03-Mar-2015 0600 2015/03/03 0700  BP:      Pulse:  91 91 92  Temp:   98.2 F (36.8 C) 98.6 F (37 C)  TempSrc:   Core (Comment) Core (Comment)  Resp:      Height:      Weight: 292 lb 5.3 oz (132.6 kg)     SpO2:  96% 96% 95%    Intake/Output from previous day:  Intake/Output Summary (Last 24 hours) at 03-03-15 0726 Last data filed at 2015/03/03 0600  Gross per 24 hour  Intake 1462.8 ml  Output   1725 ml  Net -262.2 ml    Physical Exam: Physical exam: Well-developed well-nourished; intubated and sedated Skin is warm and dry.  HEENT is normal.  Chest with mild rhonchi anteriorly Cardiovascular exam is regular rate and rhythm.  Abdominal exam not distended. No masses palpated. Extremities show no edema. neuro not assessed as pt sedated and intubated    Lab Results: Basic Metabolic Panel:  Recent Labs  77/41/28 0420 March 03, 2015 0415  NA 138 139  K 4.5 4.2  CL 103 106  CO2 19* 23  GLUCOSE 124* 85  BUN 29* 29*  CREATININE 2.92* 1.96*  CALCIUM 8.8* 8.7*  MG 2.1 1.9  PHOS 7.3* 3.9   CBC:  Recent Labs  02/17/15 0420 2015-03-03 0415  WBC 17.2* 7.8  HGB 15.8 14.2  HCT 48.1 43.6  MCV 80.8 80.4  PLT 191 139*   Cardiac Enzymes:  Recent Labs  02/26/2015 1530 03/04/2015 2030  TROPONINI 0.14* 0.09*     Assessment/Plan:  1 status post cardiac arrest-patient remains intubated and sedated. Continue amiodarone. Patient would need ICD if he recovers from a neurological standpoint. 2 anoxic encephalopathy-Prognosis appears to be poor for neurological recovery. Neurology following. 3 nonischemic cardiomyopathy-will reinitiate cardiac meds if he recovers and is extubated. 4 chronic stage III renal insufficiency 5 hypertension-follow blood pressure and add IV medications if needed. Would start with IV metoprolol and then IV hydralazine if necessary.  Olga Millers 03-Mar-2015, 7:26 AM

## 2015-03-13 NOTE — Progress Notes (Addendum)
Wasted morphine with two RNs per protocol.  Malachi Pro RN Witness

## 2015-03-13 NOTE — Progress Notes (Signed)
PCCM Interval Note  I met with the patient's family, mother, siblings, sons, cousins, Patent examiner all present.  I explained to them that he has a severe brain injury and that prognosis for any meaningful recovery is extremely poor. They voiced understanding. I have recommended that we transition him to comfort care approach. They agree. I will start morphine in addition to his seizure control regimen, stop other meds, change code status to DNR/I. I have asked his family to let us know when they have gathered and are ready to withdraw MV.   Critical Care time 30 minutes.   Baltazar Apo, MD, PhD 03-09-2015, 10:51 AM [facility] [phone] or if no answer [phone]

## 2015-03-13 NOTE — Progress Notes (Signed)
Pt expired at 1234. Verified by 2 RNs per protocol. No RR. No HR. Comfort measures ongoing with family. CDS notified. MD & elink notified. Will prepare for portmortum care.

## 2015-03-13 NOTE — Progress Notes (Signed)
PULMONARY / CRITICAL CARE MEDICINE   Name: Gregory Chase MRN: [id number] DOB: 1965-12-01    ADMISSION DATE:  02/28/2015 CONSULTATION DATE:  02/27/2015  REFERRING MD:  [facility] ED  CHIEF COMPLAINT:  Cardiac Arrest.  HISTORY OF PRESENT ILLNESS:  Pt is encephelopathic; therefore, this HPI is obtained from chart review. Gregory Chase is a 50 y.o. 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  He had recent admission to [facility] 10/30/14 through 11/01/14 for AoC systolic and diastolic CHF.  On 02/02, he was brought to [facility] with cardiac arrest.  He was using restroom at home and wife heard a loud noise.  When she went to restroom, she found him on the floor in between the commode and the tub.  He was apparently unresponsive and shaking as if he were seizing.  She was unable to arouse him; therefore, she called EMS.  Firefighters were first to scene and on their arrival, pt reportedly did have a pulse.  While they were there, he lost pulses and had VT / VF arrest.  He received 1 shock and 4 epi's prior to ROSC.  Total time of ACLS was roughly 30 minutes.  In ED, he was intubated for airway protection and was then transferred to [facility] for further evaluation and management.  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  No acute process. CXR 02/02: cardiomegaly with vascular congestion. Port CXR 2/3: ETT 2.6cm above carina. No focal consolidation. EEG 2/5 >> consistent with status epilepticus.   Head Ct 2/5 >> more evidence cerebral edema, progressive  MICROBIOLOGY: Blood 02/03 > Urine 02/03 > Sputum 02/03 > MRSA PCR 2/3:  Negative  ANTIBIOTICS: None.  SIGNIFICANT EVENTS: 02/02 - admitted following cardiac arrest.  LINES/TUBES: OETT 8.0 02/02 > R Fem CVL 02/03 > R Fem A Line 02/03 > OGT 02/02 > Foley 02/02 > PIV x3  SUBJECTIVE:  Note EEG results from 2/5 and Dr Alene Mires recommendations > medical  coma was deferred given overall clinical picture   VITAL SIGNS: BP 121/83 mmHg  Pulse 91  Temp(Src) 98.8 F (37.1 C) (Core (Comment))  Resp 24  Ht 6\' 1"  (1.854 m)  Wt 132.6 kg (292 lb 5.3 oz)  BMI 38.58 kg/m2  SpO2 97%  HEMODYNAMICS: CVP:  [6 mmHg-11 mmHg] 6 mmHg  VENTILATOR SETTINGS: Vent Mode:  [-] PRVC FiO2 (%):  [40 %-50 %] 50 % Set Rate:  [24 bmp] 24 bmp Vt Set:  [622 mL] 620 mL PEEP:  [5 cmH20] 5 cmH20 Plateau Pressure:  [23 cmH20-30 cmH20] 25 cmH20  INTAKE / OUTPUT: I/O last 3 completed shifts: In: 2219 [I.V.:1069; NG/GT:660; IV Piggyback:490] Out: 2025 [Urine:2025]  PHYSICAL EXAMINATION: General: Obese AA male, sedated and does not withdraw to pain.Marland Kitchen Neuro: Sedated, non-responsive. Pupils are pinpoint, no corneals.  Respiratory drive intact. HEENT: [place]/AT. PERRL, sclerae anicteric. Cardiovascular: RRR, no M/R/G.  Lungs: Respirations shallow and unlabored.  CTA bilaterally, No W/R/R. Abdomen: Obese, BS x 4, soft, NT/ND.  Musculoskeletal: No gross deformities, no edema.  Skin: Intact, warm, no rashes.  LABS:  BMET  Recent Labs Lab 02/16/15 2048 02/17/15 0420 03-17-15 0415  NA 140 138 139  K 4.0 4.5 4.2  CL 108 103 106  CO2 21* 19* 23  BUN 25* 29* 29*  CREATININE 2.06* 2.92* 1.96*  GLUCOSE 129* 124* 85    Electrolytes  Recent Labs Lab 02/16/15 0400  02/16/15 2048 02/17/15 0420  03/08/15 0415  CALCIUM  --   < > 8.8* 8.8* 8.7*  MG 2.1  --   --  2.1 1.9  PHOS  --   --   --  7.3* 3.9  < > = values in this interval not displayed. CBC  Recent Labs Lab 02/16/15 0400 02/16/15 0401 02/17/15 0420 03-08-2015 0415  WBC 13.9*  --  17.2* 7.8  HGB 15.2 17.7* 15.8 14.2  HCT 45.9 52.0 48.1 43.6  PLT 170  --  191 139*    Coag's  Recent Labs Lab 02/14/15 2211 02/23/2015 0301 03/01/2015 0402 03/11/2015 1030  APTT 32  --  25 28  INR 1.18 1.20 1.21 1.18   Sepsis Markers  Recent Labs Lab 03/01/2015 0301 02/21/2015 1030 03/08/2015 1445 02/25/2015 1956  02/16/15 0400 02/17/15 0420  LATICACIDVEN  --  1.8 1.7 1.6  --   --   PROCALCITON 1.76  --   --   --  3.69 3.59   ABG  Recent Labs Lab 02/22/2015 0209 02/17/15 0353 March 08, 2015 0424  PHART 7.278* 7.203* 7.355  PCO2ART 48.5* 49.3* 40.7  PO2ART 159.0* 78.0* 159*   Liver Enzymes  Recent Labs Lab 02/14/15 2211 02/17/15 1053  AST 99* 38  ALT 104* 61  ALKPHOS 107 68  BILITOT 0.9 1.1  ALBUMIN 3.6 2.7*   Cardiac Enzymes  Recent Labs Lab 02/21/2015 0640 03/09/2015 1530 02/13/2015 2030  TROPONINI 0.36* 0.14* 0.09*   Glucose  Recent Labs Lab 02/17/15 2011 02/17/15 2015 02/17/15 2324 02/17/15 2327 Mar 08, 2015 0411 2015/03/08 0751  GLUCAP 40* 53* 55* 78 74 79   Imaging Ct Head Wo Contrast  02/17/2015  ADDENDUM REPORT: 02/17/2015 11:13 ADDENDUM: Critical Value/emergent results were called by telephone at the time of interpretation on 02/17/2015 at 1023 hours to Dr. Ritta Slot , who verbally acknowledged these results. Electronically Signed   By: Odessa Fleming M.D.   On: 02/17/2015 11:13  02/17/2015  CLINICAL DATA:  50 year old male status post cardiac arrest and fall, began having generalized myoclonus overnight. Query anoxic brain injury. Initial encounter. EXAM: CT HEAD WITHOUT CONTRAST CT CERVICAL SPINE WITHOUT CONTRAST TECHNIQUE: Multidetector CT imaging of the head and cervical spine was performed following the standard protocol without intravenous contrast. Multiplanar CT image reconstructions of the cervical spine were also generated. COMPARISON:  CT head and cervical spine 02/14/2015. Brain MRI 02/18/2012. FINDINGS: CT HEAD FINDINGS Intubated. Mild paranasal sinus mucosal thickening, small fluid level in the right maxillary sinus. Tympanic cavities and mastoids remain clear. Calvarium appears intact. Stable orbit and scalp soft tissues. Calcified atherosclerosis at the skull base. No ventriculomegaly. No acute intracranial hemorrhage identified. There has been subtle interval loss of  gray-white matter differentiation and sulci conspicuity in both hemispheres. Confluent hypodensity in the right PCA and posterior left MCA/ PCA watershed territory re - demonstrated and not significantly changed. Basilar cisterns remain patent. No midline shift. CT CERVICAL SPINE FINDINGS Large body habitus. Stable straightening of cervical lordosis. Visualized skull base is intact. No atlanto-occipital dissociation. Cervicothoracic junction alignment is within normal limits. Bilateral posterior element alignment is within normal limits. No acute cervical spine fracture. Grossly intact visualized upper thoracic levels. 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 Intubated and oral enteric tube in place. Fluid in the pharynx. Endotracheal tube courses into the trachea, enteric tube courses into the thoracic esophagus. Mild dependent atelectasis in  the lung apices. IMPRESSION: 1. No intracranial mass effect or loss of basilar cisterns, but gray-white matter differentiation and cortical sulci have decreased in conspicuity since 02/14/2015, highly suspicious for diffuse cerebral edema from widespread anoxic injury in this setting. 2. Stable appearance of acute to subacute right PCA and posterior left MCA/PCA infarcts. No intracranial hemorrhage. 3. No acute fracture or listhesis identified in the cervical spine. Ligamentous injury is not excluded. 4. 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 Electronically Signed: By: Odessa Fleming M.D. On: 02/17/2015 10:18   Ct Cervical Spine Wo Contrast  02/17/2015  ADDENDUM REPORT: 02/17/2015 11:13 ADDENDUM: Critical Value/emergent results were called by telephone at the time of interpretation on 02/17/2015 at 1023 hours to Dr. Ritta Slot , who verbally acknowledged these results. Electronically Signed   By: Odessa Fleming M.D.   On: 02/17/2015 11:13  02/17/2015  CLINICAL DATA:  50 year old male status post cardiac arrest and fall, began having generalized myoclonus overnight. Query anoxic brain injury. Initial encounter. EXAM: CT HEAD WITHOUT CONTRAST CT CERVICAL SPINE WITHOUT CONTRAST TECHNIQUE: Multidetector CT imaging of the head and cervical spine was performed following the standard protocol without intravenous contrast. Multiplanar CT image reconstructions of the cervical spine were also generated. COMPARISON:  CT head and cervical spine 02/14/2015. Brain MRI 02/18/2012. FINDINGS: CT HEAD FINDINGS Intubated. Mild paranasal sinus mucosal thickening, small fluid level in the right maxillary sinus. Tympanic cavities and mastoids remain clear. Calvarium appears intact. Stable orbit and scalp soft tissues. Calcified atherosclerosis at the skull base. No ventriculomegaly. No acute intracranial hemorrhage identified. There has been subtle interval loss of gray-white matter differentiation and sulci conspicuity in both hemispheres. Confluent hypodensity in the right PCA and posterior left MCA/ PCA watershed territory re - demonstrated and not significantly changed. Basilar cisterns remain patent. No midline shift. CT CERVICAL SPINE FINDINGS Large body habitus. Stable straightening of cervical lordosis. Visualized skull base is intact. No atlanto-occipital dissociation. Cervicothoracic junction alignment is within normal limits. Bilateral posterior element alignment is within normal limits. No acute cervical spine fracture. Grossly intact visualized upper thoracic levels. 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 Intubated and oral enteric tube in place. Fluid in the pharynx. Endotracheal tube  courses into the trachea, enteric tube courses into the thoracic esophagus. Mild dependent atelectasis in the lung apices. IMPRESSION: 1. No intracranial mass effect or loss of basilar cisterns, but gray-white matter differentiation and cortical sulci have decreased in conspicuity since 02/14/2015, highly suspicious for diffuse cerebral edema from widespread anoxic injury in this setting. 2. Stable appearance of acute to subacute right PCA and posterior left MCA/PCA infarcts. No intracranial hemorrhage. 3. No acute fracture or listhesis identified in the cervical spine. Ligamentous injury is not excluded. 4. 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 Electronically Signed: By: Odessa Fleming M.D. On: 02/17/2015 10:18   Dg Chest Port 1 View  03-18-15  CLINICAL DATA:  Intubation. EXAM: PORTABLE CHEST 1 VIEW COMPARISON:  02/17/2015. FINDINGS: Endotracheal tube and NG tube in stable position. Cardiomegaly with pulmonary vascular prominence and mild interstitial prominence. Mild component congestive heart failure cannot be excluded. No overt pulmonary alveolar edema. No pleural effusion or pneumothorax identified. Left  costophrenic angle not imaged. IMPRESSION: 1. Lines and tubes in stable position. 2. Stable cardiomegaly with mild pulmonary interstitial prominence suggesting mild CHF. No interim change or progression . Electronically Signed   By: Maisie Fus  Register   On: 2015-03-09 07:12    ASSESSMENT / PLAN:  CARDIOVASCULAR A:  S/p cardiac arrest - roughly 30 - 35  minutes ACLS prior to ROSC. Hx combined heart failure - Echo from Oct 2016 with LVEF 18%, grade 1 DD. P:  Goal MAP > 65 Echo noted per cards. Cardiology consult appreciated. Continue amiodarone. Hold outpatient carvedilol, digoxin, furosemide, entresto given hypotension.  NEUROLOGIC A:   Acute metabolic encephalopathy with concern for anoxic  brain injury. CT of the head with diffuse anoxic injury. Status epilepticus 2/5 > in setting severe injury on Head CT   P:   Sedation: Propofol added 2/5 Keppra IV bid + depakote Neuro consult appreciated. Note that medication induced coma unlikely to change the prognosis here so deferred    PULMONARY A: VDRF due to inability to protect airway - secondary to cardiac arrest. Mild pulmonary edema. OSA / probable OHS.  P:   Full vent support, hold weaning given mental status. Suspect that we will discuss withdrawal of care. Clearly he would need trach for extended MV Albuterol PRN. Port CXR tomorrow intermittently  RENAL A:   Hypokalemia - Replacing. Acute Renal Failure Lactic Acidosis - Mild.  P:   Correct electrolytes as indicated. Trending Renal Function Monitor UOP with Foley.  GASTROINTESTINAL A:   No acute issues.  P:   Protonix IV daily. Consult nutrition for TF as per nutrition.  HEMATOLOGIC A:   No acute issues.  P:  SCD's Heparin Vian q8hr  INFECTIOUS A:   No acute issues.  P:   F/U on cultures.  ENDOCRINE A:   Hyperglycemia - no h/o DM.  P:   ICU hyperglycemia protocol. Assess Hgb A1c. Hold abx as ordered.  Family updated:  Dr Delton Coombes to meet with family formally on 2/6 regarding prognosis and goals for care.   Interdisciplinary Family Meeting v Palliative Care Meeting:  Due by: 02/21/15.  The patient is critically ill with multiple organ systems failure and requires high complexity decision making for assessment and support, frequent evaluation and titration of therapies, application of advanced monitoring technologies and extensive interpretation of multiple databases.   Independent CC time 40 minutes   Levy Pupa, MD, PhD 03-09-15, 9:06 AM [facility] [phone] or if no answer [phone]

## 2015-03-13 NOTE — Progress Notes (Signed)
RT called to pt's room due to decreasing Sat. Increased FIO2 to 60%. Sat recovered to 93%

## 2015-03-13 NOTE — Clinical Documentation Improvement (Signed)
Critical Care  Cerebral Edema is documented in CT Cervical Spine w/o Contrast dated 02/17/15. CDI and Coders can't abstract diagnoses from Radiology Reports. If you agree with findings please document your response in next progress note NOT in BPA drop down box. Thanks.   Other  Clinically Undetermined  Supporting Information:  Please exercise your independent, professional judgment when responding. A specific answer is not anticipated or expected.  Thank You, Shellee Milo RN, BSN, CCDS Health Information Management [place] [phone]; Cell: [phone]

## 2015-03-13 NOTE — Procedures (Signed)
Extubation Procedure Note  Patient Details:   Name: Gregory Chase DOB: 18-Jan-1965 MRN: [id number]   Airway Documentation:     Evaluation  O2 sats: currently acceptable Complications: No apparent complications Patient did tolerate procedure well. Bilateral Breath Sounds: Rhonchi Suctioning: Airway No   Terminal wean  [name] 03/11/2015, 12:47 PM

## 2015-03-13 DEATH — deceased

## 2015-04-09 ENCOUNTER — Telehealth: Payer: Self-pay | Admitting: Emergency Medicine

## 2015-04-09 NOTE — Telephone Encounter (Signed)
LVM for Thayer Ohm to return call.

## 2015-04-09 NOTE — Telephone Encounter (Signed)
Spoke with pt Son(Christ)-- received a letter from insurance needing to know if the patient had a heart attack  Patient had separate coverage for CHF (life insurance) - in the event that he have a heart attack it would be covered under this insurance. Thayer Ohm (son)was told that he suffered cardiac arrest at the hospital and needing to know what exactly the cause of death was.  Needing this put in a letter if related to a heart attack.

## 2015-04-09 NOTE — Telephone Encounter (Signed)
Please let Gregory Chase know that I have reviewed his dad's chart, labs, notes. As best we can tell his father's cardiac arrest was not caused by a heart attack (based on his EKG and labs). He did experience heart damage as a result of low blood flow and oxygen delivery to his heart while he was being resuscitated, just as he was injured elsewhere - kidneys, liver, brain. I suspect that this is not the kind of heart injury for which he was covered by his policy, but I think they should forward this information to the claim managers to determine this for sure.

## 2015-04-10 NOTE — Telephone Encounter (Signed)
lmtcb x1 for pt's son. He is not listed in the pt's emergency contacts and there is not a DPR in the pt's chart.

## 2015-04-10 NOTE — Telephone Encounter (Signed)
Pt's wife is calling - [phone]

## 2015-04-10 NOTE — Telephone Encounter (Signed)
Spoke with pt's wife and read her RB's notes. She is asking for a letter to be able to send to the insurance company that would include RB's statements. Routed to [facility] for review.

## 2015-04-10 NOTE — Telephone Encounter (Signed)
Spoke with pt's son, Thayer Ohm. Made him aware that we can't release this information to since we do not have him listed in the pt's chart. He understood and while have his mother call to get this information. Will message open until pt's wife calls back.

## 2015-04-18 ENCOUNTER — Encounter: Payer: Self-pay | Admitting: Emergency Medicine

## 2015-04-18 NOTE — Telephone Encounter (Signed)
lmomtcb for pt's wife.  Letter placed at front for pick up.

## 2015-04-18 NOTE — Telephone Encounter (Signed)
RB please advise on letter.  Thanks.

## 2015-04-18 NOTE — Telephone Encounter (Signed)
I have written the letter for family to come pick up.

## 2015-04-19 NOTE — Telephone Encounter (Signed)
Spoke with pt's wife.  Advised letter ready and at front office for pick up.  Office address given.  Wife verbalized understanding and voiced no further questions or concerns at this time.

## 2016-07-19 IMAGING — CT CT HEAD W/O CM
3 of 6 series · 14 of 47 positions shown, 16 images · non-contrast
Comparison: CT head and cervical spine 02/14/2015. Brain MRI
02/18/2012.

ADDENDUM:
Critical Value/emergent results were called by telephone at the time
of interpretation on 02/17/2015 at 7406 hours to Dr. AKLORBORTU
JON BJARNE , who verbally acknowledged these results.
CLINICAL DATA: 49-year-old male status post cardiac arrest and
fall, began having generalized myoclonus overnight. Query anoxic
brain injury. Initial encounter.

EXAM:
CT HEAD WITHOUT CONTRAST
CT CERVICAL SPINE WITHOUT CONTRAST
TECHNIQUE: Multidetector CT imaging of the head and cervical spine was
performed following the standard protocol without intravenous
contrast. Multiplanar CT image reconstructions of the cervical spine
were also generated.

[Series 302: soft tissue, idose (2) · axial · 0.35mm/px · z∈[+189,+347]mm · 8 of 103 slices shown, 10 images]
[im 12/103  brain]
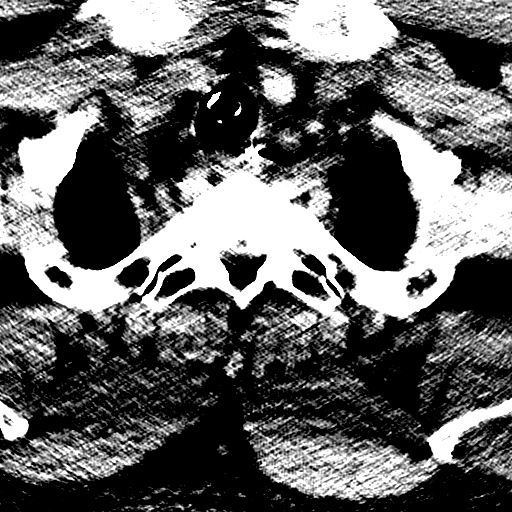
[im 12/103  bone]
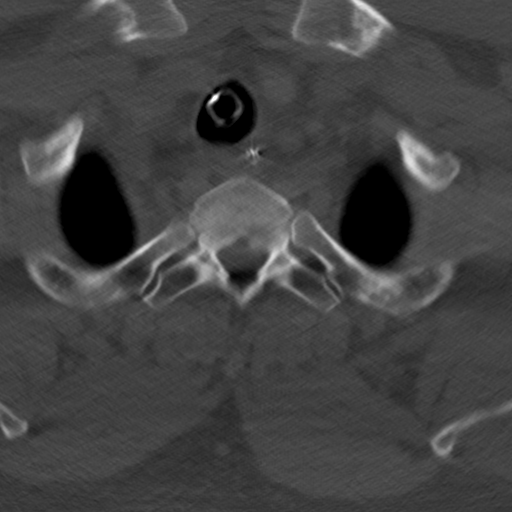
[im 23/103  brain]
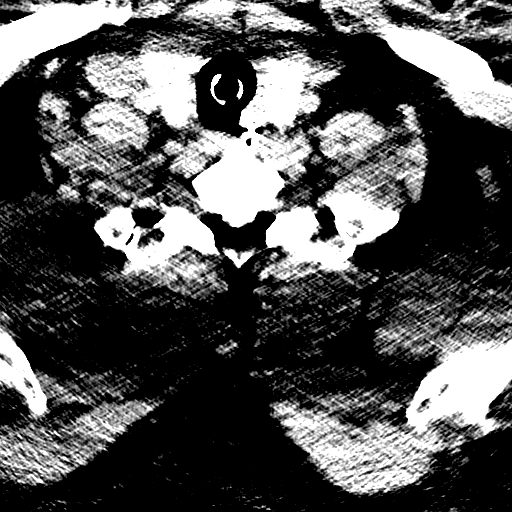
[im 35/103  brain]
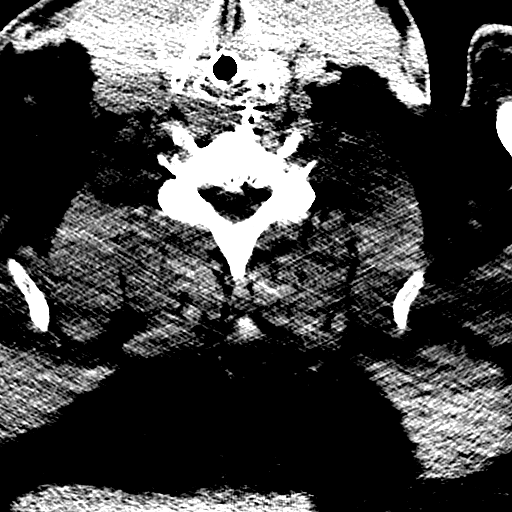
[im 46/103  brain]
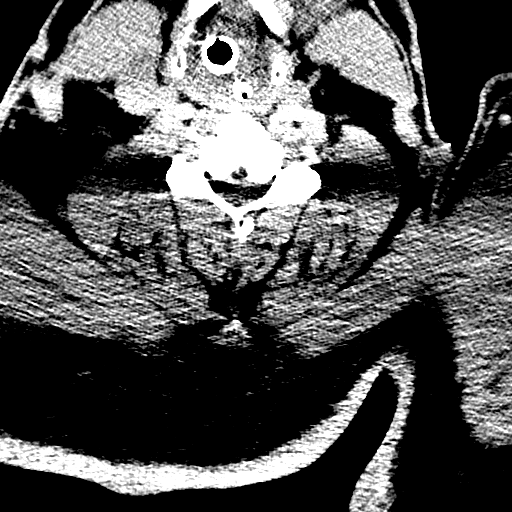
[im 57/103  brain]
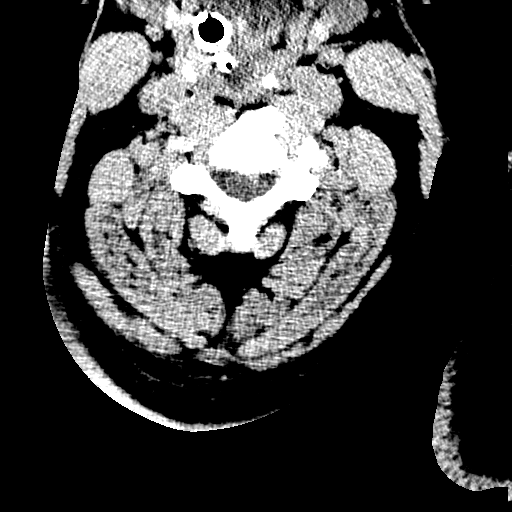
[im 57/103  bone]
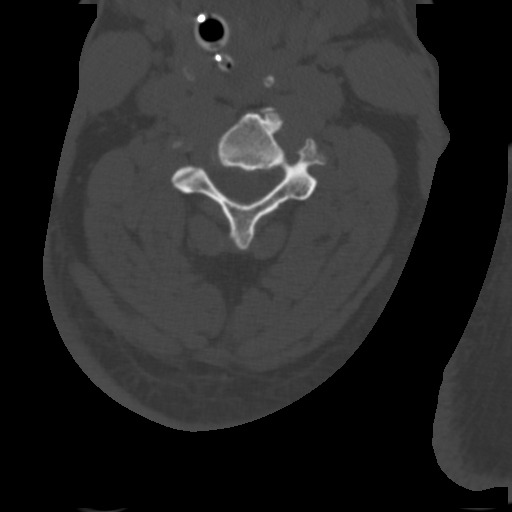
[im 69/103  brain]
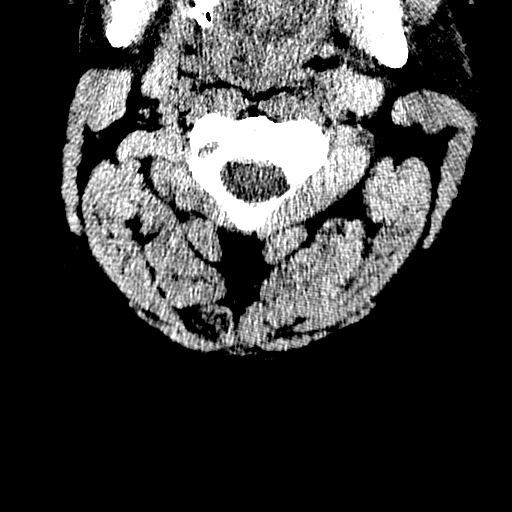
[im 80/103  brain]
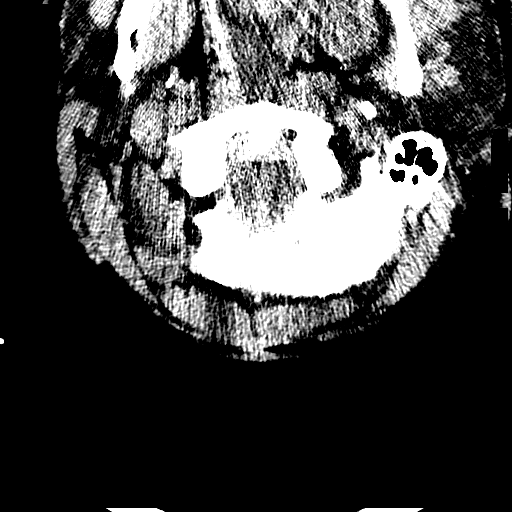
[im 91/103  brain]
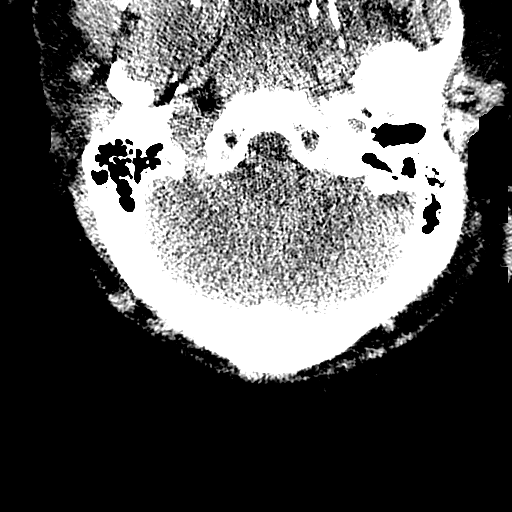

[Series 304: coronal, idose (2) · coronal · 0.34mm/px · 3 of 62 slices shown]
[im 21/62  brain]
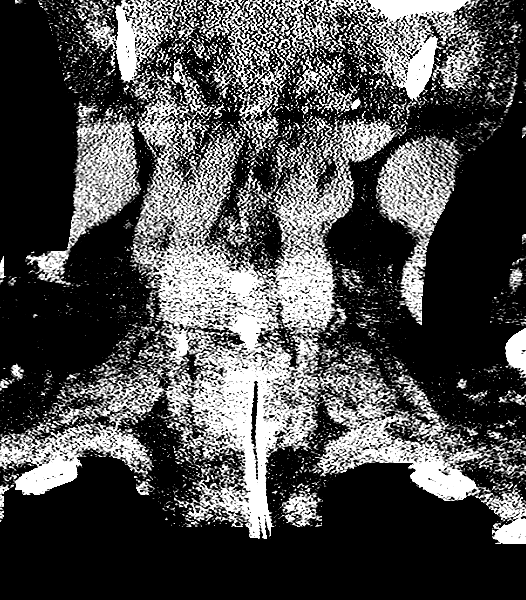
[im 28/62  brain]
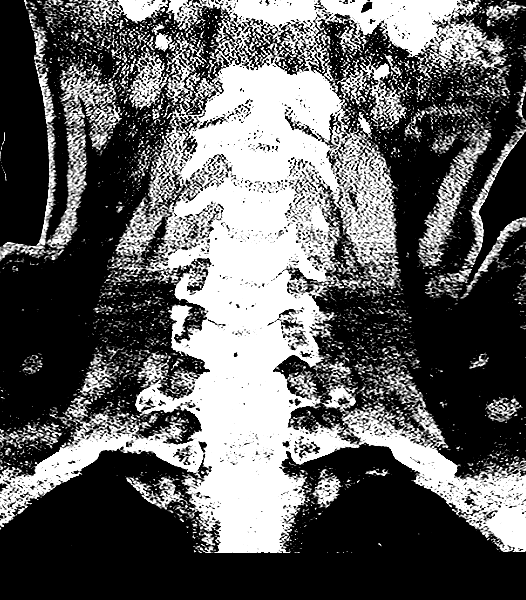
[im 34/62  brain]
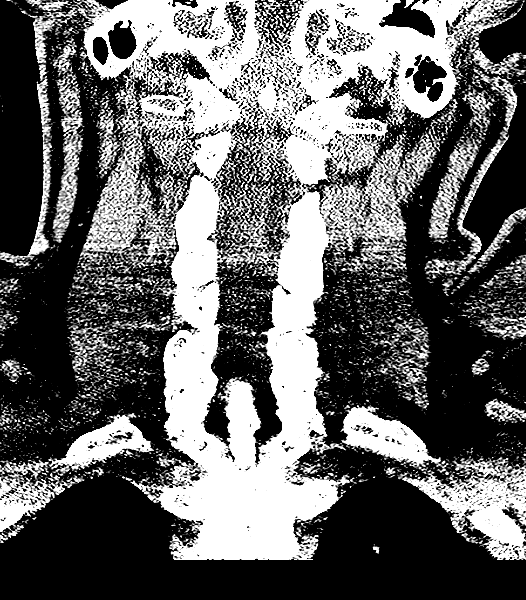

[Series 305: sagittal, idose (2) · sagittal · 0.34mm/px · 3 of 73 slices shown]
[im 25/73  brain]
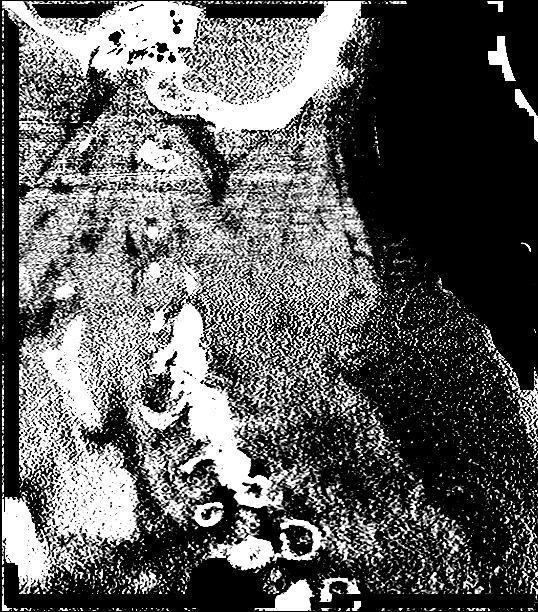
[im 37/73  brain]
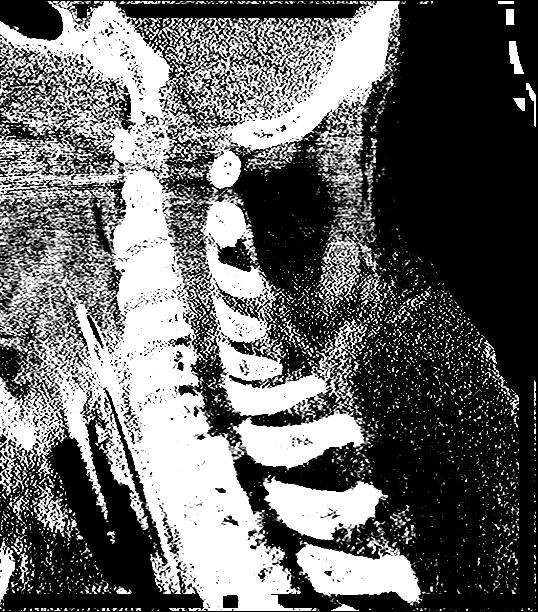
[im 49/73  brain]
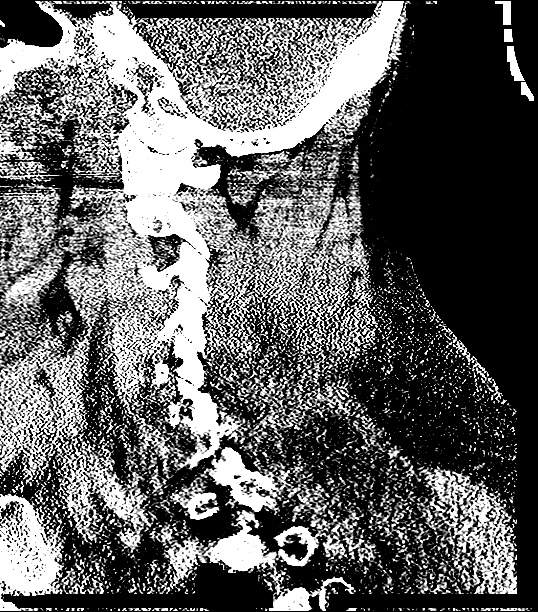

[14 of 47 positions shown; findings below may reference images not displayed]

FINDINGS: CT HEAD FINDINGS

Intubated. Mild paranasal sinus mucosal thickening, small fluid
level in the right maxillary sinus. Tympanic cavities and mastoids
remain clear. Calvarium appears intact. Stable orbit and scalp soft
tissues.

Calcified atherosclerosis at the skull base. No ventriculomegaly. No
acute intracranial hemorrhage identified.

There has been subtle interval loss of gray-white matter
differentiation and sulci conspicuity in both hemispheres. Confluent
hypodensity in the right PCA and posterior left MCA/ PCA watershed
territory re - demonstrated and not significantly changed. Basilar
cisterns remain patent. No midline shift.

CT CERVICAL SPINE FINDINGS

Large body habitus. Stable straightening of cervical lordosis.
Visualized skull base is intact. No atlanto-occipital dissociation.
Cervicothoracic junction alignment is within normal limits.
Bilateral posterior element alignment is within normal limits. No
acute cervical spine fracture. Grossly intact visualized upper
thoracic levels.

There is bulky ossification of the posterior longitudinal ligament
from C6-C7 to the mid T1 level (sagittal image 37 and series 301,
image 75) with mild to moderate associated spinal stenosis.

Intubated and oral enteric tube in place. Fluid in the pharynx.
Endotracheal tube courses into the trachea, enteric tube courses
into the thoracic esophagus. Mild dependent atelectasis in the lung
apices.
IMPRESSION: 1. No intracranial mass effect or loss of basilar cisterns, but
gray-white matter differentiation and cortical sulci have decreased
in conspicuity since 02/14/2015, highly suspicious for diffuse
cerebral edema from widespread anoxic injury in this setting.
2. Stable appearance of acute to subacute right PCA and posterior
left MCA/PCA infarcts. No intracranial hemorrhage.
3. No acute fracture or listhesis identified in the cervical spine.
Ligamentous injury is not excluded.
4. Mild to moderate degenerative cervical spinal stenosis from C6-C7
to the mid T1 level related to ossification of the posterior
longitudinal ligament.

## 2016-07-20 IMAGING — CR DG CHEST 1V PORT
1 series · 1 of 1 positions shown · non-contrast
Comparison: 02/17/2015.

CLINICAL DATA: Intubation.

EXAM:
PORTABLE CHEST 1 VIEW

[AP]
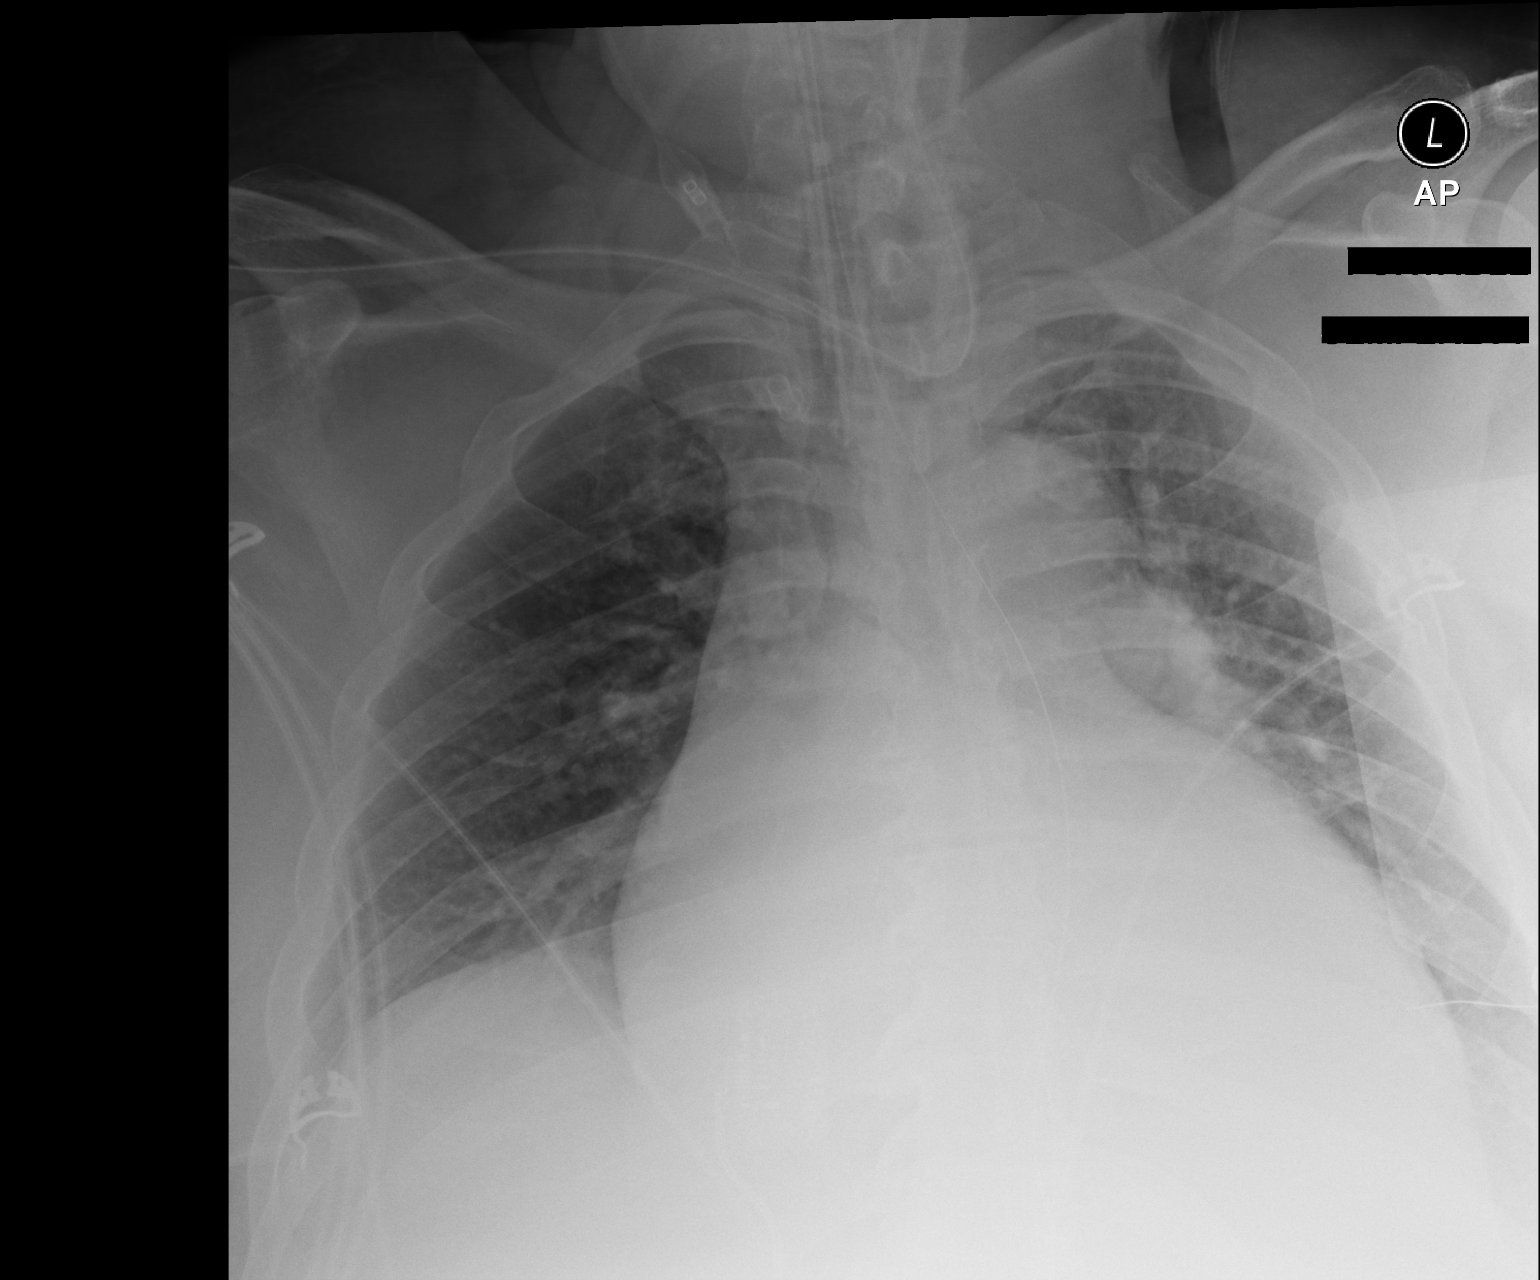

[1 of 1 positions shown; findings below may reference images not displayed]

FINDINGS: Endotracheal tube and NG tube in stable position. Cardiomegaly with
pulmonary vascular prominence and mild interstitial prominence. Mild
component congestive heart failure cannot be excluded. No overt
pulmonary alveolar edema. No pleural effusion or pneumothorax
identified. Left costophrenic angle not imaged.
IMPRESSION: 1. Lines and tubes in stable position.
2. Stable cardiomegaly with mild pulmonary interstitial prominence
suggesting mild CHF. No interim change or progression .
